# Patient Record
Sex: Female | Born: 1980 | Race: White | Hispanic: No | Marital: Single | Smoking: Never smoker
Health system: Southern US, Community
[De-identification: ages and names within clinical notes are randomized; demographics above are authoritative.]

## PROBLEM LIST (undated history)

## (undated) DIAGNOSIS — R55 Syncope and collapse: Secondary | ICD-10-CM

## (undated) DIAGNOSIS — K219 Gastro-esophageal reflux disease without esophagitis: Secondary | ICD-10-CM

## (undated) DIAGNOSIS — R1310 Dysphagia, unspecified: Secondary | ICD-10-CM

## (undated) HISTORY — PX: EYE SURGERY: SHX253

## (undated) HISTORY — DX: Dysphagia, unspecified: R13.10

## (undated) HISTORY — DX: Syncope and collapse: R55

---

## 2005-12-15 ENCOUNTER — Ambulatory Visit: Payer: Self-pay | Admitting: Family Medicine

## 2005-12-20 ENCOUNTER — Ambulatory Visit: Payer: Self-pay | Admitting: Family Medicine

## 2013-06-10 ENCOUNTER — Encounter (HOSPITAL_COMMUNITY): Payer: Self-pay | Admitting: Emergency Medicine

## 2013-06-10 ENCOUNTER — Emergency Department (HOSPITAL_COMMUNITY)
Admission: EM | Admit: 2013-06-10 | Discharge: 2013-06-11 | Disposition: A | Discharge status: Home / Self Care | Payer: PRIVATE HEALTH INSURANCE | Attending: Emergency Medicine | Admitting: Emergency Medicine

## 2013-06-10 DIAGNOSIS — Z3202 Encounter for pregnancy test, result negative: Secondary | ICD-10-CM | POA: Insufficient documentation

## 2013-06-10 DIAGNOSIS — N939 Abnormal uterine and vaginal bleeding, unspecified: Secondary | ICD-10-CM

## 2013-06-10 DIAGNOSIS — D259 Leiomyoma of uterus, unspecified: Secondary | ICD-10-CM | POA: Insufficient documentation

## 2013-06-10 DIAGNOSIS — R42 Dizziness and giddiness: Secondary | ICD-10-CM | POA: Insufficient documentation

## 2013-06-10 LAB — CBC
HCT: 39.8 % (ref 36.0–46.0)
Hemoglobin: 13.3 g/dL (ref 12.0–15.0)
MCH: 28.2 pg (ref 26.0–34.0)
MCHC: 33.4 g/dL (ref 30.0–36.0)
RBC: 4.72 MIL/uL (ref 3.87–5.11)

## 2013-06-10 NOTE — ED Notes (Signed)
Bed: ZO10 Expected date:  Expected time:  Means of arrival:  Comments: EMS vag bleeding

## 2013-06-10 NOTE — ED Notes (Signed)
Pt has been having vaginal bleeding similar to a period but she states she only has one approx. 3 times a year. States she got in shower and had bleeding and felt like something 'fell out.' EMS reports pt not acting age appropriate.

## 2013-06-10 NOTE — ED Provider Notes (Signed)
CSN: [id number]     Arrival date & time 06/10/13  2218 History   First MD Initiated Contact with Patient 06/10/13 2241     Chief Complaint  Patient presents with  . Vaginal Bleeding   (Consider location/radiation/quality/duration/timing/severity/associated sxs/prior Treatment) HPI Comments: The patient is a 32 year-old female that has never be evaluated by a gynecologist, presenting the Emergency Department with a chief complaint of abnormal vaginal bleeding for 1 day. She reports she was in the shower when she noticed a large amount of blood and clots.  She also reports she felt "something drop" when asked to elaborate she stated "like my uterus was falling out". The patient reports LNMP 2 weeks ago and she has always had irregular periods (3 a year).  Denies history of STI. The pateint's mother reports the patient had a near syncopal event after seeing the blood.  She states she felt lightheaded after seeing the amount of blood in the shower. The patient's mother reports seeing the patient slowly colapse to the floor without a full LOC and no trauma to the head.  She is not sexually active and denies insertion of foreign objects into her vagina or rectum. No PCP.  The patient also follows a vegan diet and is on Vitamin B12 supplement. No history of anemia or bleeding clotting disorder.  Patient is a 32 y.o. female presenting with vaginal bleeding. The history is provided by the patient and a relative. No language interpreter was used.  Vaginal Bleeding Quality:  Bright red and clots Severity:  Moderate Onset quality:  Sudden Duration:  1 day Timing:  Constant Chronicity:  New Menstrual history:  Irregular Number of pads used:  2 Number of tampons used:  0 Possible pregnancy: no   Context: spontaneously   Context: not after urination, not during intercourse and not during urination   Context comment:  While in the shower Relieved by:  None tried Worsened by:  Nothing tried Ineffective  treatments:  None tried Associated symptoms: dizziness   Associated symptoms: no abdominal pain, no dysuria, no fever, no nausea and no vaginal discharge   Risk factors: no bleeding disorder, no hx of ectopic pregnancy, no hx of endometriosis, no gynecological surgery, no PID and no STD     History reviewed. No pertinent past medical history. Past Surgical History  Procedure Laterality Date  . Eye surgery     No family history on file. History  Substance Use Topics  . Smoking status: Never Smoker   . Smokeless tobacco: Not on file  . Alcohol Use: No   OB History   Grav Para Term Preterm Abortions TAB SAB Ect Mult Living                 Review of Systems  Constitutional: Negative for fever and chills.  Gastrointestinal: Negative for nausea, vomiting, abdominal pain, diarrhea, constipation, blood in stool, anal bleeding and rectal pain.  Genitourinary: Positive for vaginal bleeding and menstrual problem. Negative for dysuria, urgency, frequency, hematuria, vaginal discharge, genital sores and pelvic pain.  Skin: Negative for pallor and rash.  Neurological: Positive for dizziness.  Psychiatric/Behavioral: Negative for self-injury.  All other systems reviewed and are negative.    Allergies  Review of patient's allergies indicates no known allergies.  Home Medications   Current Outpatient Rx  Name  Route  Sig  Dispense  Refill  . Cyanocobalamin (VITAMIN B 12 PO)   Oral   Take 1 tablet by mouth every other day.  BP 132/73  Pulse 99  Temp(Src) 98.7 F (37.1 C) (Oral)  Resp 16  SpO2 100%  LMP 05/10/2013 Physical Exam  Nursing note and vitals reviewed. Constitutional: She appears well-developed and well-nourished. No distress.  HENT:  Head: Normocephalic and atraumatic.  Eyes: No scleral icterus.  Neck: Neck supple.  Cardiovascular: Normal rate and regular rhythm.   No murmur heard. Pulmonary/Chest: Breath sounds normal. She has no wheezes. She has no  rales.  Abdominal: Soft. Bowel sounds are normal. She exhibits no distension. There is no tenderness. There is no rebound and no guarding.  Genitourinary: There is bleeding around the vagina.  Large amount of bright red blood and clots in the vaginal vault.  Unable to visualize cervix.  Neurological: She is alert.  Skin: Skin is warm and dry.  No signs of anemia    ED Course  Procedures (including critical care time) Labs Review Labs Reviewed  CBC  POCT PREGNANCY, URINE   Imaging Review No results found.  EKG Interpretation   None       MDM   1. Uterine fibroid   2. Vaginal bleeding    Pt with a history of irregular periods presents with abnormal vaginal bleeding only 2 weeks after last menstrual cycle. Abdomen non-tender, no signs of anemia on exam. UA-hCG ordered, CBC to evaluate for anemia.  Pelvic ordered. On Pelvic exam there was bright red blood and  Several clots in the vaginal vault, despite trying to absorb this with gauze I was unable to visualize the cervix.  Also there was a piece of tissue that came into view each time the specula was inserted. And I was unable to identify the etiology of the tissue.  Discussed pt condition and pelvic exam with Dr. Norlene Campbell who agreed to perform a pelvic for identification of cervix and tissue.  Dr. Norlene Campbell to consult OB/GYN. OB/GYN will follow up with the patient in clinic for treatment. Discussed lab results and treatment plan with the patient and patient's mother.  She reports understanding and no other concerns at this time.   Patient is stable for discharge at this time.     Clabe Seal, PA-C 06/12/13 1600  Leotis Shames Doretha Imus, PA-C 06/12/13 [phone]

## 2013-06-11 MED ORDER — HYDROCODONE-ACETAMINOPHEN 5-325 MG PO TABS: 1.0000 | ORAL_TABLET | ORAL | Status: DC | PRN

## 2013-06-15 NOTE — ED Provider Notes (Signed)
Medical screening examination/treatment/procedure(s) were conducted as a shared visit with non-physician practitioner(s) and myself.  I personally evaluated the patient during the encounter.  Pt with heavy bleeding, mass noted extruding from cervical os on pelvic exam.  D/w on call gyn who agrees most likely pedunculated fibroid.  Hemodynamically stable, h/h stable.  Pt to f/u with their clinic as outpatient for further evaluation and treatment.    Olivia Mackie, MD 06/15/13 [phone]

## 2013-06-16 ENCOUNTER — Encounter: Payer: Self-pay | Admitting: Obstetrics and Gynecology

## 2013-06-16 ENCOUNTER — Other Ambulatory Visit (HOSPITAL_COMMUNITY)
Admission: RE | Admit: 2013-06-16 | Discharge: 2013-06-16 | Disposition: A | Discharge status: Home / Self Care | Payer: PRIVATE HEALTH INSURANCE | Source: Ambulatory Visit | Attending: Obstetrics and Gynecology | Admitting: Obstetrics and Gynecology

## 2013-06-16 ENCOUNTER — Ambulatory Visit (INDEPENDENT_AMBULATORY_CARE_PROVIDER_SITE_OTHER): Payer: PRIVATE HEALTH INSURANCE | Admitting: Obstetrics and Gynecology

## 2013-06-16 VITALS — BP 151/94 | HR 86 | Ht 63.0 in | Wt 186.4 lb

## 2013-06-16 DIAGNOSIS — N841 Polyp of cervix uteri: Secondary | ICD-10-CM | POA: Insufficient documentation

## 2013-06-16 DIAGNOSIS — N898 Other specified noninflammatory disorders of vagina: Secondary | ICD-10-CM

## 2013-06-16 DIAGNOSIS — N939 Abnormal uterine and vaginal bleeding, unspecified: Secondary | ICD-10-CM

## 2013-06-16 NOTE — Progress Notes (Signed)
Patient ID: Holly Bates, female   DOB: 23-Oct-1980, 32 y.o.   MRN: [id number] 32 yo G0 with LMP 12/2 here for ED follow up for abnormal uterine bleeding. Patient reports having irregular cycles at baseline and reports having 4-6 cycles per year. Patient states she had a normal 7 day period in late November but her bleeding returned heavier 2 weeks later with cramping pains and passage of clots. Patient was seen in ED and told she had a fibroid coming out. Patient is otherwise without complaints. Patient states this was the first time she has experienced abnormal vaginal bleeding.  History reviewed. No pertinent past medical history. Past Surgical History  Procedure Laterality Date  . Eye surgery     Family History  Problem Relation Age of Onset  . Heart disease Father    History  Substance Use Topics  . Smoking status: Never Smoker   . Smokeless tobacco: Not on file  . Alcohol Use: No   GENERAL: Well-developed, well-nourished female in no acute distress.  ABDOMEN: Soft, nontender, nondistended. No organomegaly. PELVIC: Normal external female genitalia. Vagina is pink and rugated.  Normal discharge. Normal appearing cervix with 5 cm polyp extending into vagina. Uterus is normal in size. No adnexal mass or tenderness. EXTREMITIES: No cyanosis, clubbing, or edema, 2+ distal pulses.  A/P 32 yo G0 with abnormal vaginal bleeding - After informed consent was obtained, the base of the cervical polyp was grasped with a Kelly clamp and removed without difficulty. Minimal bleeding noted. Patient tolerated the procedure well. - RTC in 2 weeks for results and further management of oligomenorrhea. Discussed need for contraception in this setting in order to decrease risk of endometrial cancer. Patient verbalized understanding and birth control options were discussed.

## 2013-06-16 NOTE — Patient Instructions (Signed)
Contraception Choices °Birth control (contraception) is the use of any methods or devices to stop pregnancy from happening. Below are some methods to help avoid pregnancy. °HORMONAL BIRTH CONTROL °· A small tube put under the skin of the upper arm (implant). The tube can stay in place for 3 years. The implant must be taken out after 3 years. °· Shots given every 3 months. °· Pills taken every day. °· Patches that are changed once a week. °· A ring put into the vagina (vaginal ring). The ring is left in place for 3 weeks and removed for 1 week. Then, a new ring is put in the vagina. °· Emergency birth control pills taken after unprotected sex (intercourse). °BARRIER BIRTH CONTROL  °· A thin covering worn on the penis (female condom) during sex. °· A soft, loose covering put into the vagina (female condom) before sex. °· A rubber bowl that sits over the cervix (diaphragm). The bowl must be made for you. The bowl is put into the vagina before sex. The bowl is left in place for 6 to 8 hours after sex. °· A small, soft cup that fits over the cervix (cervical cap). The cup must be made for you. The cup can be left in place for 48 hours after sex. °· A sponge that is put into the vagina before sex. °· A chemical that kills or stops sperm from getting into the cervix and uterus (spermicide). The chemical may be a cream, jelly, foam, or pill. °INTRAUTERINE (IUD) BIRTH CONTROL  °· IUD birth control is a small, T-shaped piece of plastic. The plastic is put inside the uterus. There are 2 types of IUD: °· Copper IUD. The IUD is covered in copper wire. The copper makes a fluid that kills sperm. It can stay in place for 10 years. °· Hormone IUD. The hormone stops pregnancy from happening. It can stay in place for 5 years. °PERMANENT METHODS °· When the woman has her fallopian tubes sealed, tied, or blocked during surgery. This stops the egg from traveling to the uterus. °· The doctor places a small coil or insert into each fallopian  tube. This causes scar tissue to form and blocks the fallopian tubes. °· When the female has the tubes that carry sperm tied off (vasectomy). °NATURAL FAMILY PLANNING BIRTH CONTROL  °· Natural family planning means not having sex or using barrier birth control on the days the woman could become pregnant. °· Use a calendar to keep track of the length of each period and know the days she can get pregnant. °· Avoid sex during ovulation. °· Use a thermometer to measure body temperature. Also watch for symptoms of ovulation. °· Time sex to be after the woman has ovulated. °Use condoms to help protect yourself against sexually transmitted infections (STIs). Do this no matter what type of birth control you use. Talk to your doctor about which type of birth control is best for you. °Document Released: 04/23/2009 Document Revised: 02/26/2013 Document Reviewed: 01/15/2013 °ExitCare® Patient Information ©2014 [employer]. ° °

## 2013-06-16 NOTE — Progress Notes (Signed)
Pt started cycle on 06/10/13 and it was very heavy, passing clots. She reports that she fainted and went to the emergency room for evaluation. Still bleeding today but is lighter.

## 2013-06-18 ENCOUNTER — Telehealth: Payer: Self-pay | Admitting: *Deleted

## 2013-06-18 NOTE — Telephone Encounter (Signed)
Message copied by Mannie Stabile on Wed Jun 18, 2013 11:33 AM ------      Message from: CONSTANT, PEGGY      Created: Wed Jun 18, 2013  8:56 AM       Please inform patient that mass on cervix was a benign polyp. She should continue to keep a bleeding calendar and return to office if abnormal bleeding persists. She should also follow up with PCP or with [place] for birth control initiation for the management of her irregular bleeding pattern (as I had discussed with her) ------

## 2013-06-18 NOTE — Telephone Encounter (Signed)
Called patient and a female answered and stated that she wasn't home now and he couldn't take a message.

## 2013-06-19 NOTE — Telephone Encounter (Signed)
Called patient and informed her of results and recommendations. Patient verbalized understanding and stated that she has already made an appt with [place] for next month for possible PCOS. Patient had no further questions

## 2013-07-01 ENCOUNTER — Encounter: Payer: Self-pay | Admitting: *Deleted

## 2013-08-04 ENCOUNTER — Ambulatory Visit (INDEPENDENT_AMBULATORY_CARE_PROVIDER_SITE_OTHER): Payer: PRIVATE HEALTH INSURANCE | Admitting: Obstetrics and Gynecology

## 2013-08-04 ENCOUNTER — Encounter: Payer: Self-pay | Admitting: Obstetrics and Gynecology

## 2013-08-04 VITALS — BP 161/94 | HR 104 | Temp 97.0°F | Ht 63.0 in | Wt 188.9 lb

## 2013-08-04 DIAGNOSIS — Z3049 Encounter for surveillance of other contraceptives: Secondary | ICD-10-CM

## 2013-08-04 DIAGNOSIS — Z01812 Encounter for preprocedural laboratory examination: Secondary | ICD-10-CM

## 2013-08-04 DIAGNOSIS — IMO0001 Reserved for inherently not codable concepts without codable children: Secondary | ICD-10-CM

## 2013-08-04 LAB — POCT PREGNANCY, URINE: Preg Test, Ur: NEGATIVE

## 2013-08-04 MED ORDER — MEDROXYPROGESTERONE ACETATE 104 MG/0.65ML ~~LOC~~ SUSP
104.0000 mg | Freq: Once | SUBCUTANEOUS | Status: AC
  Administered 2013-08-04: 104 mg via SUBCUTANEOUS

## 2013-08-04 NOTE — Progress Notes (Signed)
Patient ID: Holly Bates, female   DOB: 03/27/1981, 33 y.o.   MRN: [id number] 33 yo with oligomenorrhea likely secondary to PCOS presenting today to initiate birth control as previously recommended to decrease risk of endometrial hyperplasia/carcinoma. Patient has never taken birth control in the past. All birth control options were reviewed and explained to the patient. Patient is interested in OCP. After further probing, patient reports elevated BP over the past few months along with today's visit. Discussed progesterone only options and patient opted for Depo-Provera.  Patient to follow up with PCP for further evaluation and management of her hypertension. Patient informed of normal dysfunctional vaginal bleeding that is expected after initiation of Depo-Provera. If DUB persists for more than 6 months, we will discuss other options  RTC for repeat depo and prn

## 2013-10-27 ENCOUNTER — Ambulatory Visit: Payer: PRIVATE HEALTH INSURANCE

## 2014-05-31 ENCOUNTER — Emergency Department (INDEPENDENT_AMBULATORY_CARE_PROVIDER_SITE_OTHER)
Admission: EM | Admit: 2014-05-31 | Discharge: 2014-05-31 | Disposition: A | Discharge status: Home / Self Care | Payer: Self-pay | Source: Home / Self Care | Attending: Family Medicine | Admitting: Family Medicine

## 2014-05-31 ENCOUNTER — Ambulatory Visit (HOSPITAL_COMMUNITY): Payer: Self-pay | Attending: Emergency Medicine

## 2014-05-31 ENCOUNTER — Encounter (HOSPITAL_COMMUNITY): Payer: Self-pay

## 2014-05-31 DIAGNOSIS — R109 Unspecified abdominal pain: Secondary | ICD-10-CM | POA: Insufficient documentation

## 2014-05-31 DIAGNOSIS — R110 Nausea: Secondary | ICD-10-CM

## 2014-05-31 DIAGNOSIS — M549 Dorsalgia, unspecified: Secondary | ICD-10-CM

## 2014-05-31 DIAGNOSIS — R630 Anorexia: Secondary | ICD-10-CM | POA: Insufficient documentation

## 2014-05-31 LAB — CBC WITH DIFFERENTIAL/PLATELET
BASOS ABS: 0 10*3/uL (ref 0.0–0.1)
BASOS PCT: 0 % (ref 0–1)
EOS ABS: 0.2 10*3/uL (ref 0.0–0.7)
EOS PCT: 2 % (ref 0–5)
HCT: 41.2 % (ref 36.0–46.0)
Hemoglobin: 13.9 g/dL (ref 12.0–15.0)
Lymphocytes Relative: 35 % (ref 12–46)
Lymphs Abs: 3 10*3/uL (ref 0.7–4.0)
MCH: 28.3 pg (ref 26.0–34.0)
MCHC: 33.7 g/dL (ref 30.0–36.0)
MCV: 83.9 fL (ref 78.0–100.0)
Monocytes Absolute: 0.5 10*3/uL (ref 0.1–1.0)
Monocytes Relative: 6 % (ref 3–12)
Neutro Abs: 4.8 10*3/uL (ref 1.7–7.7)
Neutrophils Relative %: 57 % (ref 43–77)
PLATELETS: 258 10*3/uL (ref 150–400)
RBC: 4.91 MIL/uL (ref 3.87–5.11)
RDW: 12.3 % (ref 11.5–15.5)
WBC: 8.4 10*3/uL (ref 4.0–10.5)

## 2014-05-31 LAB — COMPREHENSIVE METABOLIC PANEL
ALBUMIN: 4 g/dL (ref 3.5–5.2)
ALK PHOS: 66 U/L (ref 39–117)
ALT: 9 U/L (ref 0–35)
AST: 16 U/L (ref 0–37)
Anion gap: 14 (ref 5–15)
BUN: 11 mg/dL (ref 6–23)
CALCIUM: 9.3 mg/dL (ref 8.4–10.5)
CO2: 24 mEq/L (ref 19–32)
Chloride: 102 mEq/L (ref 96–112)
Creatinine, Ser: 0.66 mg/dL (ref 0.50–1.10)
GFR calc Af Amer: >90 mL/min (ref >90)
GFR calc non Af Amer: >90 mL/min (ref >90)
Glucose, Bld: 121 mg/dL — ABNORMAL HIGH (ref 70–99)
POTASSIUM: 3.2 meq/L — AB (ref 3.7–5.3)
SODIUM: 140 meq/L (ref 137–147)
TOTAL PROTEIN: 7.6 g/dL (ref 6.0–8.3)
Total Bilirubin: 0.4 mg/dL (ref 0.3–1.2)

## 2014-05-31 LAB — POCT URINALYSIS DIP (DEVICE)
BILIRUBIN URINE: NEGATIVE
GLUCOSE, UA: NEGATIVE mg/dL
HGB URINE DIPSTICK: NEGATIVE
KETONES UR: NEGATIVE mg/dL
Leukocytes, UA: NEGATIVE
Nitrite: NEGATIVE
Protein, ur: NEGATIVE mg/dL
Urobilinogen, UA: 0.2 mg/dL (ref 0.0–1.0)
pH: 5.5 (ref 5.0–8.0)

## 2014-05-31 LAB — LIPASE, BLOOD: Lipase: 30 U/L (ref 11–59)

## 2014-05-31 LAB — D-DIMER, QUANTITATIVE (NOT AT ARMC)

## 2014-05-31 MED ORDER — TRAMADOL HCL 50 MG PO TABS: 50.0000 mg | ORAL_TABLET | Freq: Four times a day (QID) | ORAL | Status: DC | PRN

## 2014-05-31 MED ORDER — ONDANSETRON 8 MG PO TBDP
8.0000 mg | ORAL_TABLET | Freq: Three times a day (TID) | ORAL | Status: DC | PRN
Start: 2014-05-31 — End: 2015-03-24

## 2014-05-31 NOTE — ED Notes (Signed)
C/o pain thoracic area x 3 days, denies injury, denies heavy lifting. NAD. W/D/color good

## 2014-05-31 NOTE — ED Provider Notes (Signed)
CSN: [id number]     Arrival date & time 05/31/14  1051 History   First MD Initiated Contact with Patient 05/31/14 1115     Chief Complaint  Patient presents with  . Back Pain   (Consider location/radiation/quality/duration/timing/severity/associated sxs/prior Treatment) HPI         33 year old female presents for evaluation of back pain. For the past 3 days she has had constant upper back pain that is worse with any movement. The pain is localized to her upper back but occasionally will radiate around her right flank.  She also has mild nausea without vomiting. Denies fever, chills, chest pain, shortness of breath, cough. No recent travel or sick contacts. No history of DVT or PE. No personal or family history of connective tissue disorders. She does not take any hormonal contraceptives.    History reviewed. No pertinent past medical history. Past Surgical History  Procedure Laterality Date  . Eye surgery     Family History  Problem Relation Age of Onset  . Heart disease Father    History  Substance Use Topics  . Smoking status: Never Smoker   . Smokeless tobacco: Not on file  . Alcohol Use: No   OB History    No data available     Review of Systems  Gastrointestinal: Positive for nausea. Negative for vomiting, abdominal pain and diarrhea.  Musculoskeletal: Positive for back pain.    Allergies  Review of patient's allergies indicates no known allergies.  Home Medications   Prior to Admission medications   Medication Sig Start Date End Date Taking? Authorizing Provider  Cyanocobalamin (VITAMIN B 12 PO) Take 1 tablet by mouth every other day.    Historical Provider, MD  ondansetron (ZOFRAN ODT) 8 MG disintegrating tablet Take 1 tablet (8 mg total) by mouth every 8 (eight) hours as needed for nausea or vomiting. 05/31/14   Liam Graham, PA-C  traMADol (ULTRAM) 50 MG tablet Take 1-2 tablets (50-100 mg total) by mouth every 6 (six) hours as needed. 05/31/14   Freeman Caldron  Deseri Loss, PA-C   BP 150/95 mmHg  Pulse 130  Temp(Src) 98.5 F (36.9 C) (Oral)  Resp 16  SpO2 100%  LMP 05/14/2014 Physical Exam  Constitutional: She is oriented to person, place, and time. Vital signs are normal. She appears well-developed and well-nourished. No distress.  HENT:  Head: Normocephalic and atraumatic.  Right Ear: External ear normal.  Left Ear: External ear normal.  Nose: Nose normal.  Mouth/Throat: Oropharynx is clear and moist. No oropharyngeal exudate.  Eyes: Conjunctivae and EOM are normal. Pupils are equal, round, and reactive to light. Right eye exhibits no discharge. Left eye exhibits no discharge.  Neck: Normal range of motion. Neck supple.  Cardiovascular: Regular rhythm, normal heart sounds and normal pulses.  Tachycardia present.   Pulses:      Radial pulses are 2+ on the right side, and 2+ on the left side.  BP equal bilateral arms  Pulmonary/Chest: Effort normal and breath sounds normal. No accessory muscle usage. No respiratory distress. She has no wheezes. She has no rales. She exhibits no mass and no tenderness.  Musculoskeletal:       Thoracic back: She exhibits tenderness and pain. She exhibits normal range of motion, no bony tenderness, no swelling, no edema, no deformity and no spasm.       Back:  Lymphadenopathy:    She has no cervical adenopathy.  Neurological: She is alert and oriented to person, place, and time. She  has normal strength and normal reflexes. No cranial nerve deficit or sensory deficit. She exhibits normal muscle tone. She displays a negative Romberg sign. Coordination and gait normal. GCS eye subscore is 4. GCS verbal subscore is 5. GCS motor subscore is 6.  Skin: Skin is warm and dry. No rash noted. She is not diaphoretic.  Psychiatric: She has a normal mood and affect. Judgment normal.  Nursing note and vitals reviewed.   ED Course  Procedures (including critical care time) Labs Review Labs Reviewed  COMPREHENSIVE METABOLIC  PANEL - Abnormal; Notable for the following:    Potassium 3.2 (*)    Glucose, Bld 121 (*)    All other components within normal limits  CBC WITH DIFFERENTIAL  LIPASE, BLOOD  D-DIMER, QUANTITATIVE  POCT URINALYSIS DIP (DEVICE)    Imaging Review Dg Abd Acute W/chest  05/31/2014   CLINICAL DATA:  Nausea and bilateral flank pain.  Loss of appetite.  EXAM: ACUTE ABDOMEN SERIES (ABDOMEN 2 VIEW & CHEST 1 VIEW)  COMPARISON:  None.  FINDINGS: There is no evidence of dilated bowel loops or free intraperitoneal air. No radiopaque calculi or other significant radiographic abnormality is seen. Moderate fecal material in the colon without evidence of focal impaction.  Heart size and mediastinal contours are within normal limits. Mild atelectasis present at both lung bases. There is no evidence of pulmonary edema, consolidation, pneumothorax, nodule or pleural fluid.  IMPRESSION: Moderate fecal material without evidence of bowel obstruction. No acute cardiopulmonary disease.   Electronically Signed   By: Aletta Edouard M.D.   On: 05/31/2014 12:49     MDM   1. Nausea   2. Back pain    Because of the nausea, slight dizziness, tachycardia, and no known cause of her pain, her evaluation included x-ray and labs which were all normal. At rest, her heart rate is down into the 90s. workup was all negative/normal. Treat for uncomplicated back pain. ED if worsening.   Meds ordered this encounter  Medications  . traMADol (ULTRAM) 50 MG tablet    Sig: Take 1-2 tablets (50-100 mg total) by mouth every 6 (six) hours as needed.    Dispense:  20 tablet    Refill:  0    Order Specific Question:  Supervising Provider    Answer:  Lynne Leader, [place]  . ondansetron (ZOFRAN ODT) 8 MG disintegrating tablet    Sig: Take 1 tablet (8 mg total) by mouth every 8 (eight) hours as needed for nausea or vomiting.    Dispense:  12 tablet    Refill:  0    Order Specific Question:  Supervising Provider    Answer:  Lynne Leader, [place]     Liam Graham, PA-C 05/31/14 1337

## 2014-05-31 NOTE — Discharge Instructions (Signed)
Back Pain, Adult Low back pain is very common. About 1 in 5 people have back pain.The cause of low back pain is rarely dangerous. The pain often gets better over time.About half of people with a sudden onset of back pain feel better in just 2 weeks. About 8 in 10 people feel better by 6 weeks.  CAUSES Some common causes of back pain include:  Strain of the muscles or ligaments supporting the spine.  Wear and tear (degeneration) of the spinal discs.  Arthritis.  Direct injury to the back. DIAGNOSIS Most of the time, the direct cause of low back pain is not known.However, back pain can be treated effectively even when the exact cause of the pain is unknown.Answering your caregiver's questions about your overall health and symptoms is one of the most accurate ways to make sure the cause of your pain is not dangerous. If your caregiver needs more information, he or she may order lab work or imaging tests (X-rays or MRIs).However, even if imaging tests show changes in your back, this usually does not require surgery. HOME CARE INSTRUCTIONS For many people, back pain returns.Since low back pain is rarely dangerous, it is often a condition that people can learn to manageon their own.   Remain active. It is stressful on the back to sit or stand in one place. Do not sit, drive, or stand in one place for more than 30 minutes at a time. Take short walks on level surfaces as soon as pain allows.Try to increase the length of time you walk each day.  Do not stay in bed.Resting more than 1 or 2 days can delay your recovery.  Do not avoid exercise or work.Your body is made to move.It is not dangerous to be active, even though your back may hurt.Your back will likely heal faster if you return to being active before your pain is gone.  Pay attention to your body when you bend and lift. Many people have less discomfortwhen lifting if they bend their knees, keep the load close to their bodies,and  avoid twisting. Often, the most comfortable positions are those that put less stress on your recovering back.  Find a comfortable position to sleep. Use a firm mattress and lie on your side with your knees slightly bent. If you lie on your back, put a pillow under your knees.  Only take over-the-counter or prescription medicines as directed by your caregiver. Over-the-counter medicines to reduce pain and inflammation are often the most helpful.Your caregiver may prescribe muscle relaxant drugs.These medicines help dull your pain so you can more quickly return to your normal activities and healthy exercise.  Put ice on the injured area.  Put ice in a plastic bag.  Place a towel between your skin and the bag.  Leave the ice on for 15-20 minutes, 03-04 times a day for the first 2 to 3 days. After that, ice and heat may be alternated to reduce pain and spasms.  Ask your caregiver about trying back exercises and gentle massage. This may be of some benefit.  Avoid feeling anxious or stressed.Stress increases muscle tension and can worsen back pain.It is important to recognize when you are anxious or stressed and learn ways to manage it.Exercise is a great option. SEEK MEDICAL CARE IF:  You have pain that is not relieved with rest or medicine.  You have pain that does not improve in 1 week.  You have new symptoms.  You are generally not feeling well. SEEK   IMMEDIATE MEDICAL CARE IF:   You have pain that radiates from your back into your legs.  You develop new bowel or bladder control problems.  You have unusual weakness or numbness in your arms or legs.  You develop nausea or vomiting.  You develop abdominal pain.  You feel faint. Document Released: 06/26/2005 Document Revised: 12/26/2011 Document Reviewed: 10/28/2013 ExitCare Patient Information 2015 [employer]. This information is not intended to replace advice given to you by your health care provider. Make sure you  discuss any questions you have with your health care provider.  

## 2014-10-17 ENCOUNTER — Emergency Department (HOSPITAL_COMMUNITY)
Admission: EM | Admit: 2014-10-17 | Discharge: 2014-10-18 | Disposition: A | Discharge status: Home / Self Care | Payer: Self-pay | Attending: Emergency Medicine | Admitting: Emergency Medicine

## 2014-10-17 ENCOUNTER — Emergency Department (HOSPITAL_COMMUNITY): Payer: Self-pay

## 2014-10-17 ENCOUNTER — Encounter (HOSPITAL_COMMUNITY): Payer: Self-pay | Admitting: Emergency Medicine

## 2014-10-17 DIAGNOSIS — Y9289 Other specified places as the place of occurrence of the external cause: Secondary | ICD-10-CM | POA: Insufficient documentation

## 2014-10-17 DIAGNOSIS — S92354A Nondisplaced fracture of fifth metatarsal bone, right foot, initial encounter for closed fracture: Secondary | ICD-10-CM | POA: Insufficient documentation

## 2014-10-17 DIAGNOSIS — Y998 Other external cause status: Secondary | ICD-10-CM | POA: Insufficient documentation

## 2014-10-17 DIAGNOSIS — Z79899 Other long term (current) drug therapy: Secondary | ICD-10-CM | POA: Insufficient documentation

## 2014-10-17 DIAGNOSIS — S92301A Fracture of unspecified metatarsal bone(s), right foot, initial encounter for closed fracture: Secondary | ICD-10-CM

## 2014-10-17 DIAGNOSIS — Y9301 Activity, walking, marching and hiking: Secondary | ICD-10-CM | POA: Insufficient documentation

## 2014-10-17 DIAGNOSIS — W1839XA Other fall on same level, initial encounter: Secondary | ICD-10-CM | POA: Insufficient documentation

## 2014-10-17 NOTE — ED Notes (Signed)
Pt states she fell while walking her dog. Pt states she injured her R foot. Pt has pain to lateral side of R foot. States she is unable to bear wt on R foot. Pt took 2 Tylenol PTA. Rates pain 7/10.

## 2014-10-18 MED ORDER — IBUPROFEN 800 MG PO TABS: 800.0000 mg | ORAL_TABLET | Freq: Three times a day (TID) | ORAL | Status: DC

## 2014-10-18 MED ORDER — IBUPROFEN 800 MG PO TABS
800.0000 mg | ORAL_TABLET | Freq: Once | ORAL | Status: AC
  Administered 2014-10-18: 800 mg via ORAL
  Filled 2014-10-18: qty 1

## 2014-10-18 MED ORDER — HYDROCODONE-ACETAMINOPHEN 5-325 MG PO TABS: 1.0000 | ORAL_TABLET | ORAL | Status: DC | PRN

## 2014-10-18 NOTE — Discharge Instructions (Signed)
Metatarsal Fracture, Undisplaced  A metatarsal fracture is a break in the bone(s) of the foot. These are the bones of the foot that connect your toes to the bones of the ankle.  DIAGNOSIS   The diagnoses of these fractures are usually made with X-rays. If there are problems in the forefoot and x-rays are normal a later bone scan will usually make the diagnosis.   TREATMENT AND HOME CARE INSTRUCTIONS  · Treatment may or may not include a cast or walking shoe. When casts are needed the use is usually for short periods of time so as not to slow down healing with muscle wasting (atrophy).  · Activities should be stopped until further advised by your caregiver.  · Wear shoes with adequate shock absorbing capabilities and stiff soles.  · Alternative exercise may be undertaken while waiting for healing. These may include bicycling and swimming, or as your caregiver suggests.  · It is important to keep all follow-up visits or specialty referrals. The failure to keep these appointments could result in improper bone healing and chronic pain or disability.  · Warning: Do not drive a car or operate a motor vehicle until your caregiver specifically tells you it is safe to do so.  IF YOU DO NOT HAVE A CAST OR SPLINT:  · You may walk on your injured foot as tolerated or advised.  · Do not put any weight on your injured foot for as long as directed by your caregiver. Slowly increase the amount of time you walk on the foot as the pain allows or as advised.  · Use crutches until you can bear weight without pain. A gradual increase in weight bearing may help.  · Apply ice to the injury for 15-20 minutes each hour while awake for the first 2 days. Put the ice in a plastic bag and place a towel between the bag of ice and your skin.  · Only take over-the-counter or prescription medicines for pain, discomfort, or fever as directed by your caregiver.  SEEK IMMEDIATE MEDICAL CARE IF:   · Your cast gets damaged or breaks.  · You have  continued severe pain or more swelling than you did before the cast was put on, or the pain is not controlled with medications.  · Your skin or nails below the injury turn blue or grey, or feel cold or numb.  · There is a bad smell, or new stains or pus-like (purulent) drainage coming from the cast.  MAKE SURE YOU:   · Understand these instructions.  · Will watch your condition.  · Will get help right away if you are not doing well or get worse.  Document Released: 03/18/2002 Document Revised: 09/18/2011 Document Reviewed: 02/07/2008  ExitCare® Patient Information ©2015 [employer]. This information is not intended to replace advice given to you by your health care provider. Make sure you discuss any questions you have with your health care provider.

## 2014-10-18 NOTE — ED Provider Notes (Signed)
CSN: [id number]     Arrival date & time 10/17/14  2243 History   First MD Initiated Contact with Patient 10/17/14 2307     Chief Complaint  Patient presents with  . Foot Injury     (Consider location/radiation/quality/duration/timing/severity/associated sxs/prior Treatment) Patient is a 34 y.o. female presenting with foot injury. The history is provided by the patient. No language interpreter was used.  Foot Injury Location:  Foot Injury: yes   Foot location:  R foot Pain details:    Severity:  Moderate   Duration:  3 hours Chronicity:  New Associated symptoms: no fever   Associated symptoms comment:  She injured her right foot while walking her dog. She mistepped, inverted the foot then fell on top of it causing pain and swelling to dorsal lateral surface. She states she cannot weight bear. No other injury.   History reviewed. No pertinent past medical history. Past Surgical History  Procedure Laterality Date  . Eye surgery     Family History  Problem Relation Age of Onset  . Heart disease Father    History  Substance Use Topics  . Smoking status: Never Smoker   . Smokeless tobacco: Not on file  . Alcohol Use: No   OB History    No data available     Review of Systems  Constitutional: Negative for fever and chills.  Musculoskeletal:       See HPI.  Skin: Negative.  Negative for wound.  Neurological: Negative.  Negative for numbness.      Allergies  Review of patient's allergies indicates no known allergies.  Home Medications   Prior to Admission medications   Medication Sig Start Date End Date Taking? Authorizing Provider  acetaminophen (TYLENOL) 325 MG tablet Take 650 mg by mouth every 6 (six) hours as needed for moderate pain.   Yes Historical Provider, MD  Cyanocobalamin (VITAMIN B 12 PO) Take 1 tablet by mouth every other day.   Yes Historical Provider, MD  vitamin D, CHOLECALCIFEROL, 400 UNITS tablet Take 800 Units by mouth daily.   Yes Historical  Provider, MD  ondansetron (ZOFRAN ODT) 8 MG disintegrating tablet Take 1 tablet (8 mg total) by mouth every 8 (eight) hours as needed for nausea or vomiting. Patient not taking: Reported on 10/17/2014 05/31/14   Liam Graham, PA-C  traMADol (ULTRAM) 50 MG tablet Take 1-2 tablets (50-100 mg total) by mouth every 6 (six) hours as needed. Patient not taking: Reported on 10/17/2014 05/31/14   Liam Graham, PA-C   BP 131/76 mmHg  Pulse 97  Temp(Src) 98.4 F (36.9 C) (Oral)  Resp 17  SpO2 100%  LMP 09/12/2014 Physical Exam  Constitutional: She is oriented to person, place, and time. She appears well-developed and well-nourished.  Neck: Normal range of motion.  Pulmonary/Chest: Effort normal.  Musculoskeletal:  Right foot has mild swelling over dorsal mid-foot along 5th MT. No bony deformity. Focally tender. FROM all digits.  Neurological: She is alert and oriented to person, place, and time.  Skin: Skin is warm and dry.    ED Course  Procedures (including critical care time) Labs Review Labs Reviewed - No data to display  Imaging Review Dg Foot Complete Right  10/17/2014   CLINICAL DATA:  Fall wall walking her dog. Right foot injury. Lateral foot pain. Unable to bear weight.  EXAM: RIGHT FOOT COMPLETE - 3+ VIEW  COMPARISON:  None.  FINDINGS: A nondisplaced fracture is present at the base of the fifth metatarsal. No  additional fractures are present. Mild soft tissue swelling is associated.  IMPRESSION: Nondisplaced fracture at the base of the fifth metatarsal.   Electronically Signed   By: San Morelle M.D.   On: 10/17/2014 23:57     EKG Interpretation None      MDM   Final diagnoses:  None    1. Fracture, right foot  ND fracture involving 5th MT base. Watson-Jones dressing, post-op shoe and crutches provided. Will refer to ortho for recheck.     Charlann Lange, PA-C 10/18/14 0010  Rolland Porter, MD 10/18/14 [id number]

## 2014-11-09 ENCOUNTER — Encounter (HOSPITAL_COMMUNITY): Payer: Self-pay | Admitting: Emergency Medicine

## 2014-11-09 ENCOUNTER — Emergency Department (HOSPITAL_COMMUNITY)
Admission: EM | Admit: 2014-11-09 | Discharge: 2014-11-09 | Disposition: A | Discharge status: Home / Self Care | Payer: Self-pay | Attending: Emergency Medicine | Admitting: Emergency Medicine

## 2014-11-09 ENCOUNTER — Emergency Department (HOSPITAL_COMMUNITY): Payer: Self-pay

## 2014-11-09 ENCOUNTER — Emergency Department (HOSPITAL_COMMUNITY)
Admission: EM | Admit: 2014-11-09 | Discharge: 2014-11-09 | Discharge status: Absent without leave | Payer: Self-pay | Source: Home / Self Care

## 2014-11-09 DIAGNOSIS — R0989 Other specified symptoms and signs involving the circulatory and respiratory systems: Secondary | ICD-10-CM

## 2014-11-09 DIAGNOSIS — F458 Other somatoform disorders: Secondary | ICD-10-CM | POA: Insufficient documentation

## 2014-11-09 DIAGNOSIS — Z791 Long term (current) use of non-steroidal anti-inflammatories (NSAID): Secondary | ICD-10-CM | POA: Insufficient documentation

## 2014-11-09 DIAGNOSIS — Z79899 Other long term (current) drug therapy: Secondary | ICD-10-CM | POA: Insufficient documentation

## 2014-11-09 NOTE — ED Notes (Signed)
Pt. reports a piece of candy is stuck below her throat last week , airway intact/respirations unlabored. No visible object at oral cavity . No cough or emesis .

## 2014-11-09 NOTE — Discharge Instructions (Signed)
Please read and follow all provided instructions.  Your diagnoses today include:  1. Globus sensation     Tests performed today include:  Vital signs. See below for your results today.   X-ray of neck - normal, no foreign bodies on x-ray  Medications prescribed:   None  Home care instructions:  Follow any educational materials contained in this packet.  Follow-up instructions: Please follow-up with your primary care provider as needed for further evaluation of your symptoms.  Return instructions:   Please return to the Emergency Department if you experience worsening symptoms.   Please return if you have any other emergent concerns.  Additional Information:  Your vital signs today were: BP 129/79 mmHg   Pulse 103   Temp(Src) 99.8 F (37.7 C) (Oral)   Resp 14   Ht 5\' 3"  (1.6 m)   Wt 175 lb (79.379 kg)   BMI 31.01 kg/m2   SpO2 97%   LMP 11/08/2014 If your blood pressure (BP) was elevated above 135/85 this visit, please have this repeated by your doctor within one month. ---------------

## 2014-11-09 NOTE — ED Provider Notes (Signed)
CSN: [id number]     Arrival date & time 11/09/14  1944 History  This chart was scribed for non-physician practitioner Carlisle Cater, PA-C working with Alfonzo Beers, MD by Hilda Lias, ED Scribe. This patient was seen in room TR01C/TR01C and the patient's care was started at 8:40 PM.    CC: Foreign body in throat   The history is provided by the patient. No language interpreter was used.     HPI Comments: Holly Bates is a 34 y.o. female who presents to the Emergency Department complaining of an odd sensation in her throat that has been present since 10/31/14. Pt states she feels there is a piece of candy stuck in her throat. Symptoms started after eating a piece of 'taffy candy with gummy balls in it'.  Pt notes she is able to swallow liquids and foods with no problems or regurgitation. Pt states she has tried to eat bread and gargle with salt water to remove the sensation with no relief. Pt denies any difficulty breathing and denies problems with esophagus in the past. No URI symptoms, sore throat, facial swelling, pain with movement of neck. No reported fevers.    History reviewed. No pertinent past medical history. Past Surgical History  Procedure Laterality Date  . Eye surgery     Family History  Problem Relation Age of Onset  . Heart disease Father    History  Substance Use Topics  . Smoking status: Never Smoker   . Smokeless tobacco: Not on file  . Alcohol Use: No   OB History    No data available     Review of Systems  Constitutional: Negative for fever and appetite change.  HENT: Positive for trouble swallowing (FB sensation, no difficulty swallowing). Negative for rhinorrhea and sore throat.   Eyes: Negative for redness.  Respiratory: Negative for cough.   Cardiovascular: Negative for chest pain.  Gastrointestinal: Negative for nausea, vomiting, abdominal pain and diarrhea.  Genitourinary: Negative for dysuria.  Musculoskeletal: Negative for myalgias.  Skin:  Negative for rash.  Neurological: Negative for headaches.      Allergies  Review of patient's allergies indicates no known allergies.  Home Medications   Prior to Admission medications   Medication Sig Start Date End Date Taking? Authorizing Provider  acetaminophen (TYLENOL) 325 MG tablet Take 650 mg by mouth every 6 (six) hours as needed for moderate pain.    Historical Provider, MD  Cyanocobalamin (VITAMIN B 12 PO) Take 1 tablet by mouth every other day.    Historical Provider, MD  HYDROcodone-acetaminophen (NORCO/VICODIN) 5-325 MG per tablet Take 1-2 tablets by mouth every 4 (four) hours as needed. 10/18/14   Charlann Lange, PA-C  ibuprofen (ADVIL,MOTRIN) 800 MG tablet Take 1 tablet (800 mg total) by mouth 3 (three) times daily. 10/18/14   Shari Upstill, PA-C  ondansetron (ZOFRAN ODT) 8 MG disintegrating tablet Take 1 tablet (8 mg total) by mouth every 8 (eight) hours as needed for nausea or vomiting. Patient not taking: Reported on 10/17/2014 05/31/14   Liam Graham, PA-C  traMADol (ULTRAM) 50 MG tablet Take 1-2 tablets (50-100 mg total) by mouth every 6 (six) hours as needed. Patient not taking: Reported on 10/17/2014 05/31/14   Liam Graham, PA-C  vitamin D, CHOLECALCIFEROL, 400 UNITS tablet Take 800 Units by mouth daily.    Historical Provider, MD   BP 129/79 mmHg  Pulse 103  Temp(Src) 99.8 F (37.7 C) (Oral)  Resp 14  Ht 5\' 3"  (1.6 m)  Wt  175 lb (79.379 kg)  BMI 31.01 kg/m2  SpO2 97%  LMP 11/08/2014 Physical Exam  Constitutional: She appears well-developed and well-nourished.  HENT:  Head: Normocephalic and atraumatic.  Mouth/Throat: Oropharynx is clear and moist.  Eyes: Conjunctivae are normal. Right eye exhibits no discharge. Left eye exhibits no discharge.  Neck: Normal range of motion. Neck supple. No tracheal deviation present.  Cardiovascular: Normal rate, regular rhythm and normal heart sounds.   Pulmonary/Chest: Effort normal and breath sounds normal. No  stridor. No respiratory distress.  Abdominal: Soft. There is no tenderness.  Neurological: She is alert.  Skin: Skin is warm and dry.  Psychiatric: She has a normal mood and affect.  Nursing note and vitals reviewed.   ED Course  Procedures (including critical care time)  DIAGNOSTIC STUDIES: Oxygen Saturation is 97% on room air, normal by my interpretation.    COORDINATION OF CARE: 8:46 PM Discussed treatment plan with pt at bedside and pt agreed to plan.   Labs Review Labs Reviewed - No data to display  Imaging Review Dg Neck Soft Tissue  11/09/2014   CLINICAL DATA:  Foreign body sensation in the throat. Ingested object 10/31/2014.  EXAM: NECK SOFT TISSUES - 1+ VIEW  COMPARISON:  None.  FINDINGS: There is no evidence of retropharyngeal soft tissue swelling or epiglottic enlargement. The cervical airway is unremarkable and no radio-opaque foreign body identified.  IMPRESSION: Negative.   Electronically Signed   By: Dereck Ligas M.D.   On: 11/09/2014 21:26     EKG Interpretation None       Vital signs reviewed and are as follows: Filed Vitals:   11/09/14 2139  BP: 130/78  Pulse: 81  Temp:   Resp: 14   Patient and family member informed of results. GI referral given for further evaluation if not improving. Discussed that patient may have residual irritation and no retained foreign body. Patient encouraged to return to the emergency department for fever, worsening sore throat, difficulty swallowing, difficulty breathing, pain with movement of her neck, or other concerns. Patient verbalizes understanding and agrees with plan.  MDM   Final diagnoses:  Globus sensation   Patient with foreign body sensation in her throat for greater than one week without any difficulty with swallowing or breathing. Soft tissue neck x-rays negative for any acute findings. Do not suspect infectious process. No documented fevers. No pain with movement of the neck. Exam is not suspicious for  epiglottitis or deep space neck infection or abscess.  I personally performed the services described in this documentation, which was scribed in my presence. The recorded information has been reviewed and is accurate.    Carlisle Cater, PA-C 11/09/14 2157  Alfonzo Beers, MD 11/09/14 2200

## 2014-11-23 ENCOUNTER — Encounter: Payer: Self-pay | Admitting: Gastroenterology

## 2015-01-25 ENCOUNTER — Ambulatory Visit (INDEPENDENT_AMBULATORY_CARE_PROVIDER_SITE_OTHER): Payer: Self-pay | Admitting: Gastroenterology

## 2015-01-25 ENCOUNTER — Encounter: Payer: Self-pay | Admitting: Gastroenterology

## 2015-01-25 VITALS — BP 134/80 | HR 88 | Ht 63.0 in | Wt 190.4 lb

## 2015-01-25 DIAGNOSIS — F458 Other somatoform disorders: Secondary | ICD-10-CM

## 2015-01-25 DIAGNOSIS — R0989 Other specified symptoms and signs involving the circulatory and respiratory systems: Secondary | ICD-10-CM

## 2015-01-25 NOTE — Patient Instructions (Signed)
You have been scheduled for an endoscopy. Please follow written instructions given to you at your visit today. If you use inhalers (even only as needed), please bring them with you on the day of your procedure. Your physician has requested that you go to www.startemmi.com and enter the access code given to you at your visit today. This web site gives a general overview about your procedure. However, you should still follow specific instructions given to you by our office regarding your preparation for the procedure.  You can purchase omeprazole 20 mg over the counter for you to take once daily.   Patient advised to avoid spicy, acidic, citrus, chocolate, mints, fruit and fruit juices.  Limit the intake of caffeine, alcohol and Soda.  Don't exercise too soon after eating.  Don't lie down within 3-4 hours of eating.  Elevate the head of your bed.  Thank you for choosing me and [facility].  Pricilla Riffle. Dagoberto Ligas., MD., [facility]  Normal BMI (Body Mass Index- based on height and weight) is between 19 and 25. Your BMI today is Body mass index is 33.74 kg/(m^2). Marland Kitchen Please consider follow up  regarding your BMI with your Primary Care Provider.

## 2015-01-25 NOTE — Progress Notes (Signed)
    History of Present Illness: This is a 34 year old female with globus senstion referred by Alfonzo Beers, MD. Patient states she has a sensation of a lump in her throat or something stuck in her throat for the past several months. She describes a feeling like there is a piece of candy stuck in her throat. She was seen at [facility] ED in early May and the evaluation was unremarkable including neck films. Her symptoms have persisted and are unchanged by meals. She has not tried any acid suppression therapy or any other treatments. Denies weight loss, abdominal pain, constipation, diarrhea, change in stool caliber, melena, hematochezia, nausea, vomiting, dysphagia, chest pain.  Review of Systems: Pertinent positive and negative review of systems were noted in the above HPI section. All other review of systems were otherwise negative.  Current Medications, Allergies, Past Medical History, Past Surgical History, Family History and Social History were reviewed in [employer] record.  Physical Exam: General: Well developed, well nourished, no acute distress Head: Normocephalic and atraumatic Eyes:  sclerae anicteric, EOMI Ears: Normal auditory acuity Mouth: No deformity or lesions Neck: Supple, no masses or thyromegaly Lungs: Clear throughout to auscultation Heart: Regular rate and rhythm; no murmurs, rubs or bruits Abdomen: Soft, non tender and non distended. No masses, hepatosplenomegaly or hernias noted. Normal Bowel sounds Musculoskeletal: Symmetrical with no gross deformities  Skin: No lesions on visible extremities Pulses:  Normal pulses noted Extremities: No clubbing, cyanosis, edema or deformities noted Neurological: Alert oriented x 4, grossly nonfocal Cervical Nodes:  No significant cervical adenopathy Inguinal Nodes: No significant inguinal adenopathy Psychological:  Alert and cooperative. Normal mood and affect  Assessment and Recommendations:  1. Globus  sensation. Rule out GERD. Begin Prilosec OTC 20 mg daily and standard antireflux measures. Schedule endoscopy. The risks (including bleeding, perforation, infection, missed lesions, medication reactions and possible hospitalization or surgery if complications occur), benefits, and alternatives to endoscopy with possible biopsy and possible dilation were discussed with the patient and they consent to proceed. If EGD is unrevealing and symptoms do not respond to treatment for GERD she will need further evaluation by her PCP and ENT.   cc: Alfonzo Beers, MD

## 2015-03-08 ENCOUNTER — Ambulatory Visit (AMBULATORY_SURGERY_CENTER): Payer: Self-pay | Admitting: Gastroenterology

## 2015-03-08 ENCOUNTER — Encounter: Payer: Self-pay | Admitting: Gastroenterology

## 2015-03-08 VITALS — BP 123/86 | HR 76 | Temp 98.6°F | Resp 15 | Ht 63.0 in | Wt 190.0 lb

## 2015-03-08 DIAGNOSIS — K219 Gastro-esophageal reflux disease without esophagitis: Secondary | ICD-10-CM

## 2015-03-08 DIAGNOSIS — R0989 Other specified symptoms and signs involving the circulatory and respiratory systems: Secondary | ICD-10-CM

## 2015-03-08 DIAGNOSIS — F458 Other somatoform disorders: Secondary | ICD-10-CM

## 2015-03-08 MED ORDER — SODIUM CHLORIDE 0.9 % IV SOLN: 500.0000 mL | INTRAVENOUS | Status: DC

## 2015-03-08 NOTE — Patient Instructions (Signed)
Please call Dr. Fuller Plan if globussymptoms do not resolve with an antireflux regimine and daily PPI.    YOU HAD AN ENDOSCOPIC PROCEDURE TODAY AT [facility]:   Refer to the procedure report that was given to you for any specific questions about what was found during the examination.  If the procedure report does not answer your questions, please call your gastroenterologist to clarify.  If you requested that your care partner not be given the details of your procedure findings, then the procedure report has been included in a sealed envelope for you to review at your convenience later.  YOU SHOULD EXPECT: Some feelings of bloating in the abdomen. Passage of more gas than usual.  Walking can help get rid of the air that was put into your GI tract during the procedure and reduce the bloating. If you had a lower endoscopy (such as a colonoscopy or flexible sigmoidoscopy) you may notice spotting of blood in your stool or on the toilet paper. If you underwent a bowel prep for your procedure, you may not have a normal bowel movement for a few days.  Please Note:  You might notice some irritation and congestion in your nose or some drainage.  This is from the oxygen used during your procedure.  There is no need for concern and it should clear up in a day or so.  SYMPTOMS TO REPORT IMMEDIATELY:    Following upper endoscopy (EGD)  Vomiting of blood or coffee ground material  New chest pain or pain under the shoulder blades  Painful or persistently difficult swallowing  New shortness of breath  Fever of 100F or higher  Black, tarry-looking stools  For urgent or emergent issues, a gastroenterologist can be reached at any hour by calling [phone].   DIET: Your first meal following the procedure should be a small meal and then it is ok to progress to your normal diet. Heavy or fried foods are harder to digest and may make you feel nauseous or bloated.  Likewise, meals heavy in dairy  and vegetables can increase bloating.  Drink plenty of fluids but you should avoid alcoholic beverages for 24 hours.  ACTIVITY:  You should plan to take it easy for the rest of today and you should NOT DRIVE or use heavy machinery until tomorrow (because of the sedation medicines used during the test).    FOLLOW UP: Our staff will call the number listed on your records the next business day following your procedure to check on you and address any questions or concerns that you may have regarding the information given to you following your procedure. If we do not reach you, we will leave a message.  However, if you are feeling well and you are not experiencing any problems, there is no need to return our call.  We will assume that you have returned to your regular daily activities without incident.  If any biopsies were taken you will be contacted by phone or by letter within the next 1-3 weeks.  Please call us at [phone] if you have not heard about the biopsies in 3 weeks.    SIGNATURES/CONFIDENTIALITY: You and/or your care partner have signed paperwork which will be entered into your electronic medical record.  These signatures attest to the fact that that the information above on your After Visit Summary has been reviewed and is understood.  Full responsibility of the confidentiality of this discharge information lies with you and/or your care-partner.

## 2015-03-08 NOTE — Progress Notes (Signed)
To recovery, report to Tyrell, RN, VSS. 

## 2015-03-08 NOTE — Op Note (Signed)
[facility]  [address]. [place], [address]   ENDOSCOPY PROCEDURE REPORT  PATIENT: Holly Bates, Holly Bates  MR#: [id number] BIRTHDATE: 09-24-1980 , 34  yrs. old GENDER: female ENDOSCOPIST: Ladene Artist, MD, [facility] Regal REFERRED BY:  Jill Alexanders MD PROCEDURE DATE:  03/08/2015 PROCEDURE:  EGD, diagnostic ASA CLASS:     Class II INDICATIONS:  Globus sensation, R/O GERD, LPR. MEDICATIONS: Monitored anesthesia care and Propofol 200 mg IV TOPICAL ANESTHETIC: none DESCRIPTION OF PROCEDURE: After the risks benefits and alternatives of the procedure were thoroughly explained, informed consent was obtained.  The LB [id number] endoscope was introduced through the mouth and advanced to the second portion of the duodenum , Without limitations.  The instrument was slowly withdrawn as the mucosa was fully examined.    ESOPHAGUS: The mucosa of the esophagus appeared normal. STOMACH: The mucosa and folds of the stomach appeared normal. DUODENUM: The duodenal mucosa showed no abnormalities in the bulb and 2nd part of the duodenum.  Retroflexed views revealed no abnormalities.   The scope was then withdrawn from the patient and the procedure completed.  COMPLICATIONS: There were no immediate complications.  ENDOSCOPIC IMPRESSION: 1.   The EGD appeared normal  RECOMMENDATIONS: 1.  If globus symptoms do not resolve with an anti-reflux regimen and a daily PPI would evaluate for non GI causes   [R eSigned:  Ladene Artist, MD, [facility] 03/08/2015 2:41 PM

## 2015-03-09 ENCOUNTER — Telehealth: Payer: Self-pay | Admitting: *Deleted

## 2015-03-09 NOTE — Telephone Encounter (Signed)
  Follow up Call-  Call back number 03/08/2015  Post procedure Call Back phone  # [phone]  Permission to leave phone message Yes     Patient questions:  Message left to call us if necessary.

## 2015-03-24 ENCOUNTER — Emergency Department (INDEPENDENT_AMBULATORY_CARE_PROVIDER_SITE_OTHER)
Admission: EM | Admit: 2015-03-24 | Discharge: 2015-03-24 | Disposition: A | Discharge status: Home / Self Care | Payer: Self-pay | Source: Home / Self Care | Attending: Family Medicine | Admitting: Family Medicine

## 2015-03-24 ENCOUNTER — Encounter (HOSPITAL_COMMUNITY): Payer: Self-pay | Admitting: Emergency Medicine

## 2015-03-24 DIAGNOSIS — N200 Calculus of kidney: Secondary | ICD-10-CM

## 2015-03-24 LAB — POCT URINALYSIS DIP (DEVICE)
BILIRUBIN URINE: NEGATIVE
Glucose, UA: NEGATIVE mg/dL
KETONES UR: NEGATIVE mg/dL
LEUKOCYTES UA: NEGATIVE
NITRITE: NEGATIVE
Protein, ur: NEGATIVE mg/dL
SPECIFIC GRAVITY, URINE: 1.01 (ref 1.005–1.030)
Urobilinogen, UA: 0.2 mg/dL (ref 0.0–1.0)
pH: 7 (ref 5.0–8.0)

## 2015-03-24 LAB — POCT PREGNANCY, URINE: Preg Test, Ur: NEGATIVE

## 2015-03-24 MED ORDER — DICLOFENAC SODIUM 75 MG PO TBEC: 75.0000 mg | DELAYED_RELEASE_TABLET | Freq: Two times a day (BID) | ORAL | Status: DC

## 2015-03-24 MED ORDER — KETOROLAC TROMETHAMINE 60 MG/2ML IM SOLN
INTRAMUSCULAR | Status: AC
  Filled 2015-03-24: qty 2

## 2015-03-24 MED ORDER — KETOROLAC TROMETHAMINE 60 MG/2ML IM SOLN
60.0000 mg | Freq: Once | INTRAMUSCULAR | Status: AC
  Administered 2015-03-24: 60 mg via INTRAMUSCULAR

## 2015-03-24 MED ORDER — TAMSULOSIN HCL 0.4 MG PO CAPS: 0.4000 mg | ORAL_CAPSULE | Freq: Every day | ORAL | Status: DC

## 2015-03-24 NOTE — Discharge Instructions (Signed)
He had developed a kidney stone. This will take some time to pass. Please use the Flomax to help pass the stone, drink lots of fluids to promote urination, and start the Voltaren in 24 hours for additional pain relief. If your symptoms do not improve over the next 2-3 days or get significantly worse he needs to go to the emergency room or follow-up with urology. These remember to strain your urine to look for stone.    Kidney Stones Kidney stones (urolithiasis) are deposits that form inside your kidneys. The intense pain is caused by the stone moving through the urinary tract. When the stone moves, the ureter goes into spasm around the stone. The stone is usually passed in the urine.  CAUSES   A disorder that makes certain neck glands produce too much parathyroid hormone (primary hyperparathyroidism).  A buildup of uric acid crystals, similar to gout in your joints.  Narrowing (stricture) of the ureter.  A kidney obstruction present at birth (congenital obstruction).  Previous surgery on the kidney or ureters.  Numerous kidney infections. SYMPTOMS   Feeling sick to your stomach (nauseous).  Throwing up (vomiting).  Blood in the urine (hematuria).  Pain that usually spreads (radiates) to the groin.  Frequency or urgency of urination. DIAGNOSIS   Taking a history and physical exam.  Blood or urine tests.  CT scan.  Occasionally, an examination of the inside of the urinary bladder (cystoscopy) is performed. TREATMENT   Observation.  Increasing your fluid intake.  Extracorporeal shock wave lithotripsy--This is a noninvasive procedure that uses shock waves to break up kidney stones.  Surgery may be needed if you have severe pain or persistent obstruction. There are various surgical procedures. Most of the procedures are performed with the use of small instruments. Only small incisions are needed to accommodate these instruments, so recovery time is minimized. The size,  location, and chemical composition are all important variables that will determine the proper choice of action for you. Talk to your health care provider to better understand your situation so that you will minimize the risk of injury to yourself and your kidney.  HOME CARE INSTRUCTIONS   Drink enough water and fluids to keep your urine clear or pale yellow. This will help you to pass the stone or stone fragments.  Strain all urine through the provided strainer. Keep all particulate matter and stones for your health care provider to see. The stone causing the pain may be as small as a grain of salt. It is very important to use the strainer each and every time you pass your urine. The collection of your stone will allow your health care provider to analyze it and verify that a stone has actually passed. The stone analysis will often identify what you can do to reduce the incidence of recurrences.  Only take over-the-counter or prescription medicines for pain, discomfort, or fever as directed by your health care provider.  Make a follow-up appointment with your health care provider as directed.  Get follow-up X-rays if required. The absence of pain does not always mean that the stone has passed. It may have only stopped moving. If the urine remains completely obstructed, it can cause loss of kidney function or even complete destruction of the kidney. It is your responsibility to make sure X-rays and follow-ups are completed. Ultrasounds of the kidney can show blockages and the status of the kidney. Ultrasounds are not associated with any radiation and can be performed easily in a matter  of minutes. SEEK MEDICAL CARE IF:  You experience pain that is progressive and unresponsive to any pain medicine you have been prescribed. SEEK IMMEDIATE MEDICAL CARE IF:   Pain cannot be controlled with the prescribed medicine.  You have a fever or shaking chills.  The severity or intensity of pain increases over  18 hours and is not relieved by pain medicine.  You develop a new onset of abdominal pain.  You feel faint or pass out.  You are unable to urinate. MAKE SURE YOU:   Understand these instructions.  Will watch your condition.  Will get help right away if you are not doing well or get worse. Document Released: 06/26/2005 Document Revised: 02/26/2013 Document Reviewed: 11/27/2012 [facility] Patient Information 2015 [place], [place]. This information is not intended to replace advice given to you by your health care provider. Make sure you discuss any questions you have with your health care provider.

## 2015-03-24 NOTE — ED Notes (Signed)
Pt has been suffering from left lower back pain and hematuria since last night.  She states the blood is light, but she feels more pressure in her back when she has to urinate.  She denies any fever.

## 2015-03-24 NOTE — ED Provider Notes (Signed)
CSN: [id number]     Arrival date & time 03/24/15  1301 History   First MD Initiated Contact with Patient 03/24/15 1319     Chief Complaint  Patient presents with  . Hematuria  . Back Pain   (Consider location/radiation/quality/duration/timing/severity/associated sxs/prior Treatment) HPI  Left flank pain. Started 1 day ago. Some radiation inferiorly and posteriorly, constant but waxes and wanes, naproxen with some improvement. Nothing makes the pain worse. This morning patient developed red urine. Denies any pelvic pain, vaginal discharge, fevers, nausea, vomiting, constipation, rash.  Past Medical History  Diagnosis Date  . Dysphagia    Past Surgical History  Procedure Laterality Date  . Eye surgery     Family History  Problem Relation Age of Onset  . Heart disease Father    Social History  Substance Use Topics  . Smoking status: Never Smoker   . Smokeless tobacco: None  . Alcohol Use: No   OB History    No data available     Review of Systems Per HPI with all other pertinent systems negative.   Allergies  Review of patient's allergies indicates no known allergies.  Home Medications   Prior to Admission medications   Medication Sig Start Date End Date Taking? Authorizing Provider  Cyanocobalamin (VITAMIN B 12 PO) Take 1 tablet by mouth every other day.   Yes Historical Provider, MD  omeprazole (PRILOSEC) 10 MG capsule Take 10 mg by mouth daily.   Yes Historical Provider, MD  vitamin D, CHOLECALCIFEROL, 400 UNITS tablet Take 800 Units by mouth daily.   Yes Historical Provider, MD  acetaminophen (TYLENOL) 325 MG tablet Take 650 mg by mouth every 6 (six) hours as needed for moderate pain.    Historical Provider, MD  diclofenac (VOLTAREN) 75 MG EC tablet Take 1 tablet (75 mg total) by mouth 2 (two) times daily. 03/24/15   Waldemar Dickens, MD  tamsulosin (FLOMAX) 0.4 MG CAPS capsule Take 1 capsule (0.4 mg total) by mouth daily. 03/24/15   Waldemar Dickens, MD   Meds  Ordered and Administered this Visit   Medications  ketorolac (TORADOL) injection 60 mg (not administered)    BP 138/91 mmHg  Pulse 98  Temp(Src) 98.8 F (37.1 C) (Oral)  SpO2 98%  LMP 02/25/2015 No data found.   Physical Exam Physical Exam  Constitutional: oriented to person, place, and time. appears well-developed and well-nourished. No distress.  HENT:  Head: Normocephalic and atraumatic.  Eyes: EOMI. PERRL.  Neck: Normal range of motion.  Cardiovascular: RRR, no m/r/g, 2+ distal pulses,  Pulmonary/Chest: Effort normal and breath sounds normal. No respiratory distress.  Abdominal: Soft. Bowel sounds are normal. NonTTP, no distension.  Musculoskeletal: Normal range of motion. Non ttp, no effusion. No CVA tenderness  Neurological: alert and oriented to person, place, and time.  Skin: Skin is warm. No rash noted. non diaph.oretic.  Psychiatric: normal mood and affect. behavior is normal. Judgment and thought content normal.   ED Course  Procedures (including critical care time)  Labs Review Labs Reviewed  POCT URINALYSIS DIP (DEVICE) - Abnormal; Notable for the following:    Hgb urine dipstick LARGE (*)    All other components within normal limits  POCT PREGNANCY, URINE    Imaging Review No results found.   Visual Acuity Review  Right Eye Distance:   Left Eye Distance:   Bilateral Distance:    Right Eye Near:   Left Eye Near:    Bilateral Near:  MDM   1. Nephrolithiasis     Toradol 60 mg IM given. Patient to use strainer, start Flomax, increase fluid intake, and follow up in the ED or with urology if not improving   Waldemar Dickens, MD 03/24/15 1342

## 2016-01-19 ENCOUNTER — Telehealth: Payer: Self-pay

## 2016-01-19 NOTE — Telephone Encounter (Signed)
LMTCB

## 2016-01-19 NOTE — Telephone Encounter (Signed)
Go ahead and set up an appointment

## 2016-01-19 NOTE — Telephone Encounter (Signed)
Pt mom called the office to let us know that pt used to be seen by you years ago. Pt does not have insurance now and mom needs appt tomorrow. Mom is willing to pay out of pocket. Mom wanted to know if you would be willing to accept pt as a new patient again. I informed her that we do not accept new uninsured pts, but she requested I ask anyway. Mom, Remo Lipps, can be reached at [phone] you, Wells Guiles

## 2016-01-19 NOTE — Telephone Encounter (Signed)
appt scheduled. /RLB  

## 2016-01-20 ENCOUNTER — Encounter: Payer: Self-pay | Admitting: Family Medicine

## 2016-01-20 ENCOUNTER — Ambulatory Visit (INDEPENDENT_AMBULATORY_CARE_PROVIDER_SITE_OTHER): Payer: Self-pay | Admitting: Family Medicine

## 2016-01-20 VITALS — BP 130/80 | HR 82 | Ht 62.5 in | Wt 179.2 lb

## 2016-01-20 DIAGNOSIS — K5900 Constipation, unspecified: Secondary | ICD-10-CM

## 2016-01-20 NOTE — Patient Instructions (Signed)
Plenty of fluids, use the MiraLAX regularly for the next week or 2. 2 size we'll keep you regular

## 2016-01-20 NOTE — Progress Notes (Signed)
   Subjective:    Patient ID: Holly Bates, female    DOB: 27-May-1981, 35 y.o.   MRN: [id number]  HPI She is here for evaluation of constipation. She states that this started approximately 2 weeks ago. She has had no change in her normal lifestyle in regard to physical activities or eating. She has tried MiraLAX, Dulcolax, and was as well as increasing fluids and states that it is not helped. She's had no nausea, vomiting or abdominal pain.   Review of Systems     Objective:   Physical Exam Alert and in no distress. Abdominal exam shows no masses or tenderness with normal bowel sounds. Rectal exam does show a small amount of stool present in the vault.       Assessment & Plan:  Constipation, unspecified constipation type I explained that there was minimal stool in her vault and it could be she's miss diagnosing her symptoms. Recommend fluids and regular use of MiraLAX for the next week to see if this will help. She was comfortable with this.

## 2016-10-22 IMAGING — CR DG FOOT COMPLETE 3+V*R*
3 series · 3 of 3 positions shown · non-contrast
Comparison: None.

CLINICAL DATA: Fall wall walking her dog. Right foot injury.
Lateral foot pain. Unable to bear weight.

EXAM:
RIGHT FOOT COMPLETE - 3+ VIEW

[x foot ap right]
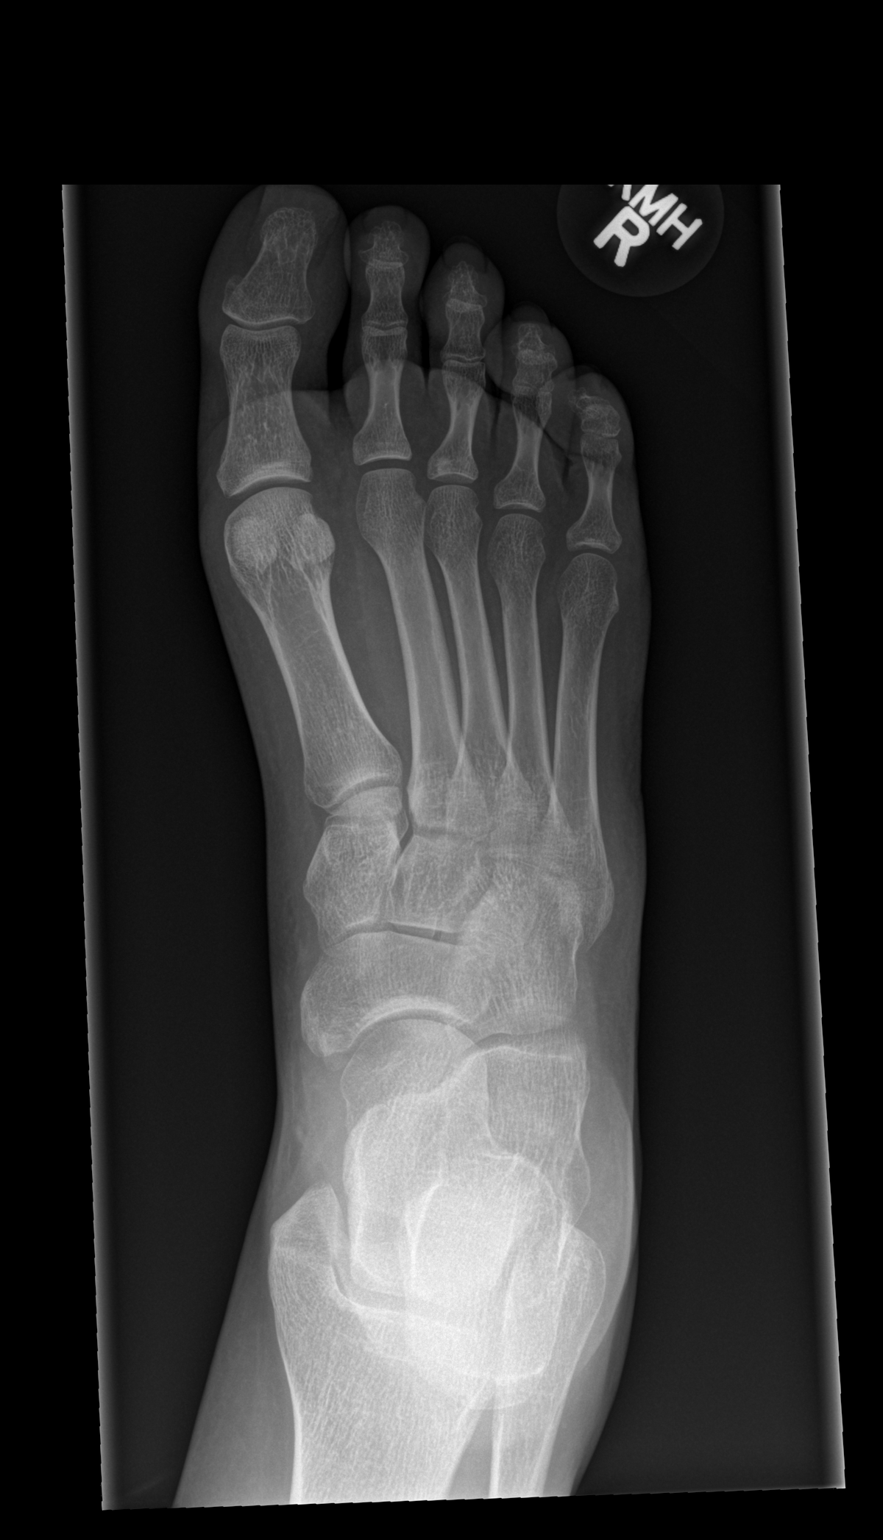

[x foot obl right]
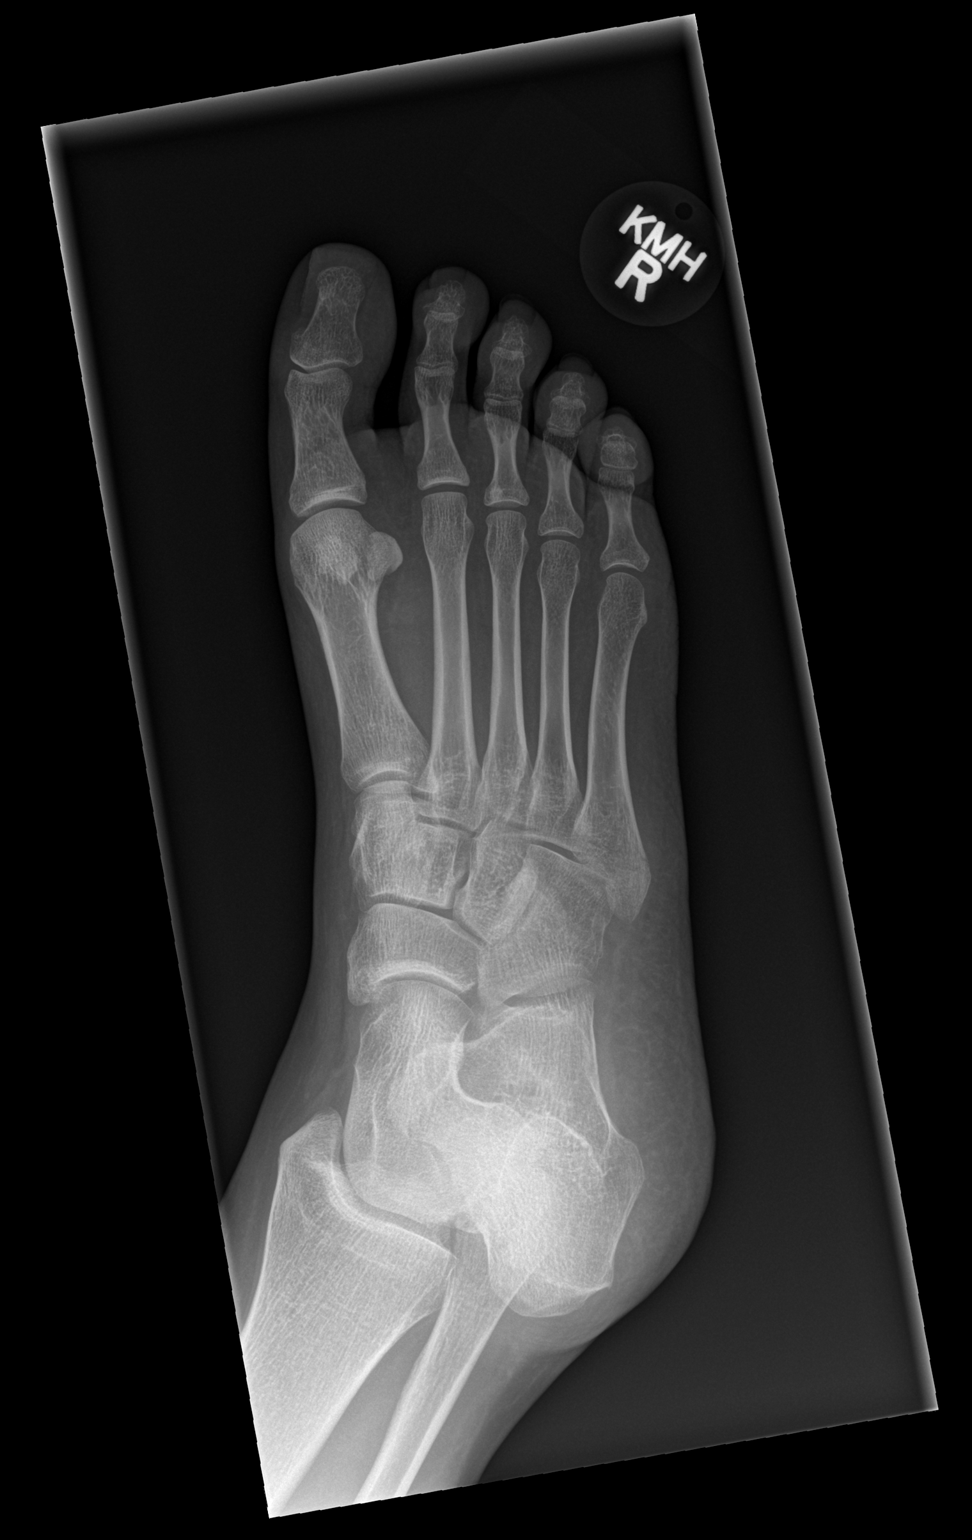

[x foot lat right]
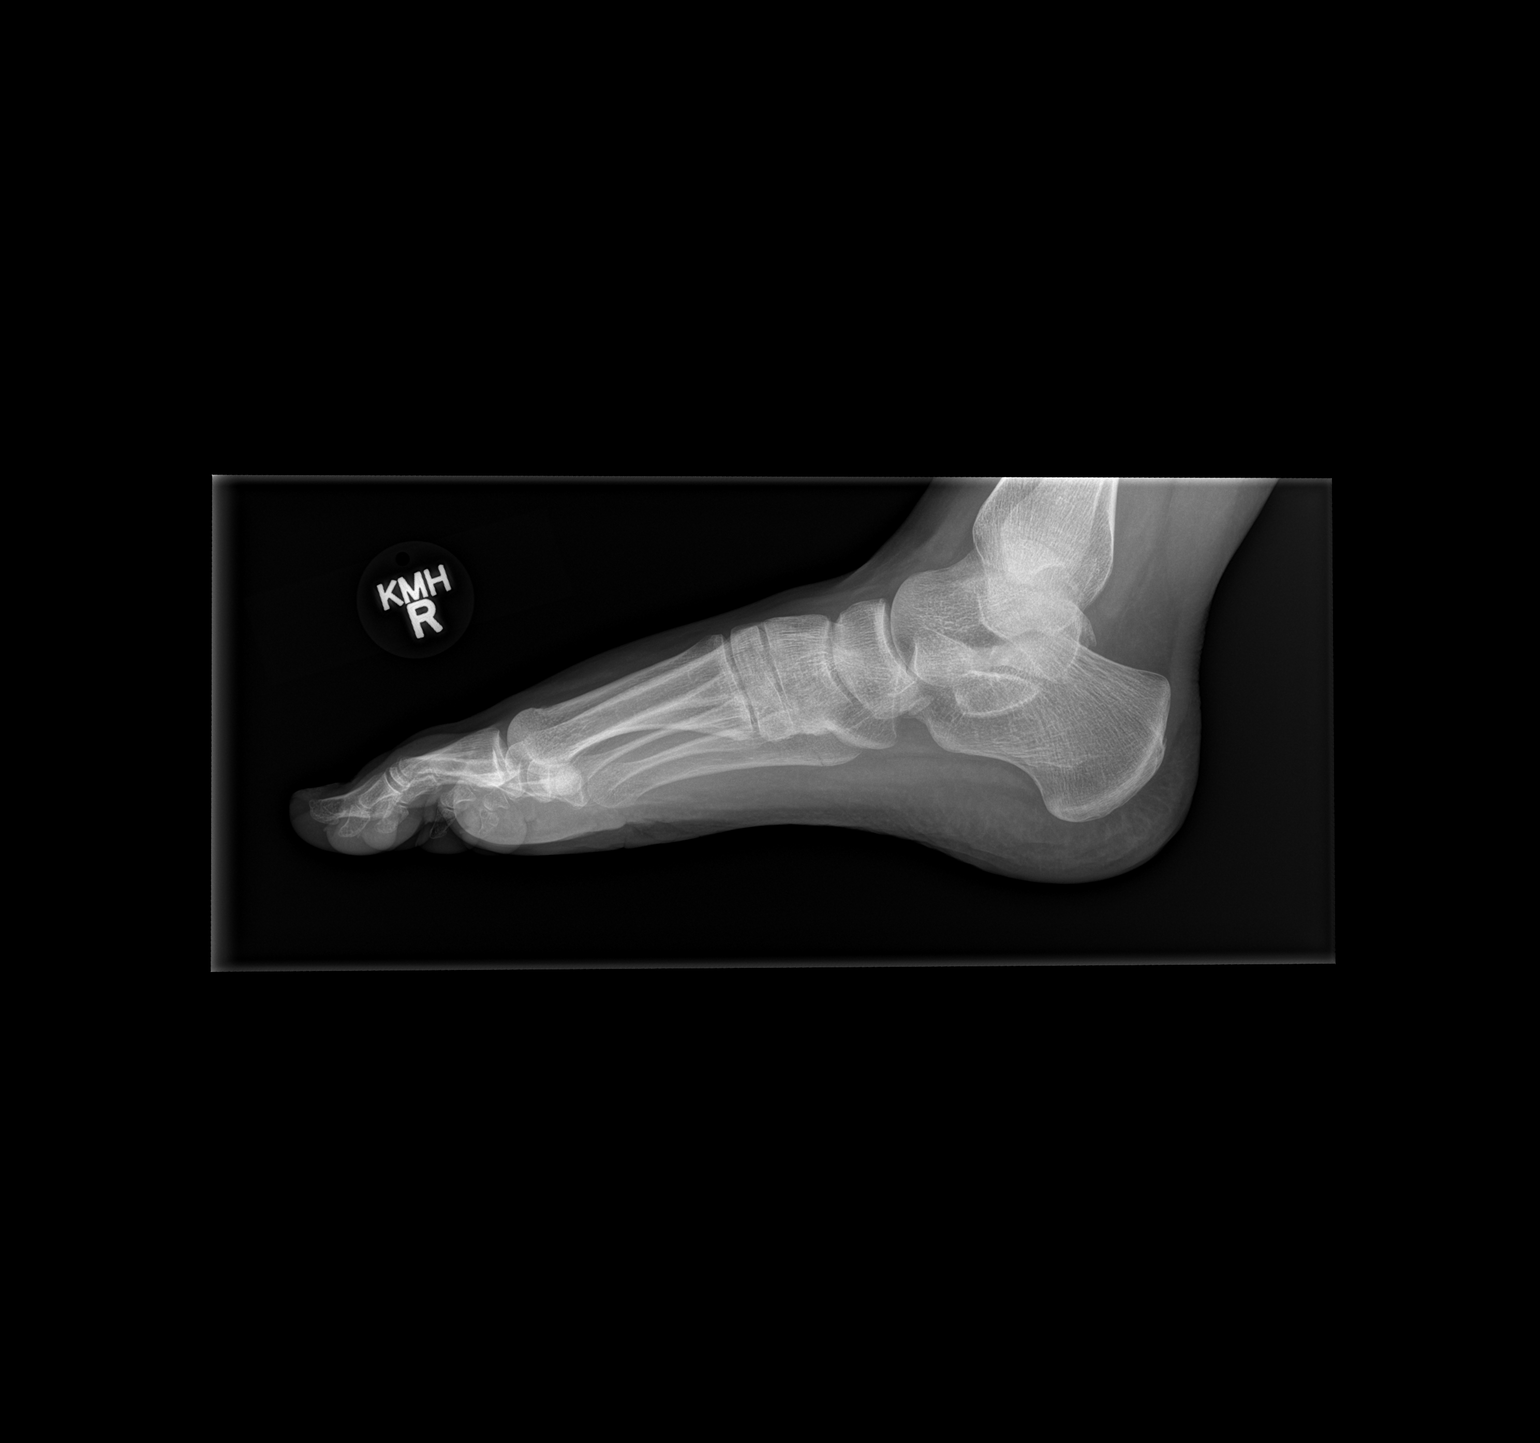

[3 of 3 positions shown; findings below may reference images not displayed]

FINDINGS: A nondisplaced fracture is present at the base of the fifth
metatarsal. No additional fractures are present. Mild soft tissue
swelling is associated.
IMPRESSION: Nondisplaced fracture at the base of the fifth metatarsal.

## 2018-01-15 ENCOUNTER — Encounter (HOSPITAL_BASED_OUTPATIENT_CLINIC_OR_DEPARTMENT_OTHER): Payer: Self-pay | Admitting: *Deleted

## 2018-01-15 ENCOUNTER — Emergency Department (HOSPITAL_BASED_OUTPATIENT_CLINIC_OR_DEPARTMENT_OTHER)
Admission: EM | Admit: 2018-01-15 | Discharge: 2018-01-15 | Disposition: A | Discharge status: Home / Self Care | Payer: Self-pay | Attending: Emergency Medicine | Admitting: Emergency Medicine

## 2018-01-15 ENCOUNTER — Other Ambulatory Visit: Payer: Self-pay

## 2018-01-15 DIAGNOSIS — Z79899 Other long term (current) drug therapy: Secondary | ICD-10-CM | POA: Insufficient documentation

## 2018-01-15 DIAGNOSIS — R55 Syncope and collapse: Secondary | ICD-10-CM | POA: Insufficient documentation

## 2018-01-15 LAB — URINALYSIS, ROUTINE W REFLEX MICROSCOPIC
Bilirubin Urine: NEGATIVE
Glucose, UA: NEGATIVE mg/dL
Hgb urine dipstick: NEGATIVE
KETONES UR: NEGATIVE mg/dL
LEUKOCYTES UA: NEGATIVE
NITRITE: NEGATIVE
PH: 8 (ref 5.0–8.0)
PROTEIN: NEGATIVE mg/dL
Specific Gravity, Urine: <1.005 — ABNORMAL LOW (ref 1.005–1.030)

## 2018-01-15 LAB — CBC WITH DIFFERENTIAL/PLATELET
Basophils Absolute: 0 10*3/uL (ref 0.0–0.1)
Basophils Relative: 0 %
EOS ABS: 0.1 10*3/uL (ref 0.0–0.7)
Eosinophils Relative: 1 %
HCT: 43.4 % (ref 36.0–46.0)
HEMOGLOBIN: 14.4 g/dL (ref 12.0–15.0)
Lymphocytes Relative: 15 %
Lymphs Abs: 1.5 10*3/uL (ref 0.7–4.0)
MCH: 28.6 pg (ref 26.0–34.0)
MCHC: 33.2 g/dL (ref 30.0–36.0)
MCV: 86.1 fL (ref 78.0–100.0)
Monocytes Absolute: 0.3 10*3/uL (ref 0.1–1.0)
Monocytes Relative: 3 %
NEUTROS PCT: 81 %
Neutro Abs: 8.5 10*3/uL — ABNORMAL HIGH (ref 1.7–7.7)
PLATELETS: 245 10*3/uL (ref 150–400)
RBC: 5.04 MIL/uL (ref 3.87–5.11)
RDW: 13.2 % (ref 11.5–15.5)
WBC: 10.4 10*3/uL (ref 4.0–10.5)

## 2018-01-15 LAB — BASIC METABOLIC PANEL
Anion gap: 7 (ref 5–15)
BUN: 10 mg/dL (ref 6–20)
CALCIUM: 9.1 mg/dL (ref 8.9–10.3)
CO2: 27 mmol/L (ref 22–32)
CREATININE: 0.66 mg/dL (ref 0.44–1.00)
Chloride: 107 mmol/L (ref 98–111)
Glucose, Bld: 104 mg/dL — ABNORMAL HIGH (ref 70–99)
Potassium: 3.6 mmol/L (ref 3.5–5.1)
SODIUM: 141 mmol/L (ref 135–145)

## 2018-01-15 LAB — PREGNANCY, URINE: PREG TEST UR: NEGATIVE

## 2018-01-15 NOTE — ED Triage Notes (Signed)
Pt c/o witnessed syncopal episode x 2 episodes today, c/o dizziness and palpatations

## 2018-01-15 NOTE — ED Provider Notes (Signed)
[facility] EMERGENCY DEPARTMENT Provider Note   CSN: [id number] Arrival date & time: 01/15/18  1403     History   Chief Complaint Chief Complaint  Patient presents with  . Loss of Consciousness    HPI Holly Bates is a 37 y.o. female who presents today for evaluation of loss of consciousness.  Her mother witnessed both of these episodes.  Patient reports that she was standing in front of the refrigerator when she started feeling lightheaded and fell to the ground.  Patient's mother states she did not hit her head, patient denies any injuries.  Approximately 5 minutes later patient was sitting at the dining room table and again slumped over passing out for a few seconds.  Both episodes only lasted a few seconds.  Patient denies any pain at this time.  Mother denies any twitching episodes.  Patient denies any symptoms other than a sore throat.  She is generally well.  Denies any recent travels, history of blood clots, coughs, headaches, or possibility of pregnancy.  HPI  Past Medical History:  Diagnosis Date  . Dysphagia     Patient Active Problem List   Diagnosis Date Noted  . Cervical polyp 06/16/2013    Past Surgical History:  Procedure Laterality Date  . EYE SURGERY       OB History   None      Home Medications    Prior to Admission medications   Medication Sig Start Date End Date Taking? Authorizing Provider  acetaminophen (TYLENOL) 325 MG tablet Take 650 mg by mouth every 6 (six) hours as needed for moderate pain. Reported on 01/20/2016    [provider]  Cyanocobalamin (VITAMIN B 12 PO) Take 1 tablet by mouth every other day. Reported on 01/20/2016    [provider]  diclofenac (VOLTAREN) 75 MG EC tablet Take 1 tablet (75 mg total) by mouth 2 (two) times daily. Patient not taking: Reported on 01/20/2016 03/24/15   Waldemar Dickens, MD  Multiple Vitamins-Minerals (MULTIVITAMIN WITH MINERALS) tablet Take 1 tablet by mouth daily.     [provider]  Omega 3 1000 MG CAPS Take by mouth.    [provider]  omeprazole (PRILOSEC) 10 MG capsule Take 10 mg by mouth daily. Reported on 01/20/2016    [provider]  tamsulosin (FLOMAX) 0.4 MG CAPS capsule Take 1 capsule (0.4 mg total) by mouth daily. Patient not taking: Reported on 01/20/2016 03/24/15   Waldemar Dickens, MD  vitamin D, CHOLECALCIFEROL, 400 UNITS tablet Take 800 Units by mouth daily. Reported on 01/20/2016    [provider]    Family History Family History  Problem Relation Age of Onset  . Heart disease Father     Social History Social History   Tobacco Use  . Smoking status: Never Smoker  . Smokeless tobacco: Never Used  Substance Use Topics  . Alcohol use: No  . Drug use: No     Allergies   Patient has no known allergies.   Review of Systems Review of Systems  Constitutional: Negative for chills and fever.  HENT: Negative for ear pain and sore throat.   Eyes: Negative for pain and visual disturbance.  Respiratory: Negative for cough and shortness of breath.   Cardiovascular: Negative for chest pain and palpitations.  Gastrointestinal: Negative for abdominal pain and vomiting.  Genitourinary: Negative for dysuria and hematuria.  Musculoskeletal: Negative for arthralgias and back pain.  Skin: Negative for color change and rash.  Neurological:  Positive for syncope. Negative for seizures.  All other systems reviewed and are negative.    Physical Exam Updated Vital Signs BP 108/65 (BP Location: Left Arm)   Pulse 71   Temp 98.1 F (36.7 C) (Oral)   Resp 18   Ht 5\' 3"  (1.6 m)   Wt 70.3 kg (155 lb)   LMP 01/13/2018   SpO2 99%   BMI 27.46 kg/m   Physical Exam  Constitutional: She is oriented to person, place, and time. She appears well-developed and well-nourished. No distress.  HENT:  Head: Normocephalic and atraumatic.  Eyes: Pupils are equal, round, and reactive to light. Conjunctivae and EOM  are normal. Right eye exhibits no discharge. Left eye exhibits no discharge. No scleral icterus.  Neck: Normal range of motion. Neck supple. No JVD present.  Cardiovascular: Normal rate, regular rhythm, normal heart sounds and intact distal pulses.  No murmur heard. Pulmonary/Chest: Effort normal and breath sounds normal. No stridor. No respiratory distress.  Abdominal: She exhibits no distension.  Musculoskeletal: She exhibits no edema or deformity.  Neurological: She is alert and oriented to person, place, and time. She exhibits normal muscle tone.  Mental Status:  Alert, oriented, thought content appropriate, able to give a coherent history. Speech fluent without evidence of aphasia. Able to follow 2 step commands without difficulty.  Cranial Nerves:  II:  Peripheral visual fields grossly normal, pupils equal, round, reactive to light III,IV, VI: ptosis not present, extra-ocular motions intact bilaterally  V,VII: smile symmetric, facial light touch sensation equal VIII: hearing grossly normal to voice  X: uvula elevates symmetrically  XI: bilateral shoulder shrug symmetric and strong XII: midline tongue extension without fassiculations Motor:  Normal tone. 5/5 in upper and lower extremities bilaterally including strong and equal grip strength and dorsiflexion/plantar flexion Cerebellar: normal finger-to-nose with bilateral upper extremities Gait: normal gait and balance CV: distal pulses palpable throughout    Skin: Skin is warm and dry. She is not diaphoretic.  Psychiatric: She has a normal mood and affect. Her behavior is normal.  Nursing note and vitals reviewed.    ED Treatments / Results  Labs (all labs ordered are listed, but only abnormal results are displayed) Labs Reviewed  URINALYSIS, ROUTINE W REFLEX MICROSCOPIC - Abnormal; Notable for the following components:      Result Value   Specific Gravity, Urine <1.005 (*)    All other components within normal limits    BASIC METABOLIC PANEL - Abnormal; Notable for the following components:   Glucose, Bld 104 (*)    All other components within normal limits  CBC WITH DIFFERENTIAL/PLATELET - Abnormal; Notable for the following components:   Neutro Abs 8.5 (*)    All other components within normal limits  PREGNANCY, URINE    EKG EKG Interpretation  Date/Time:  Tuesday January 15 2018 14:20:14 EDT Ventricular Rate:  91 PR Interval:    QRS Duration: 101 QT Interval:  373 QTC Calculation: 459 R Axis:   18 Text Interpretation:  Sinus rhythm Borderline T wave abnormalities Confirmed by Julianne Rice [phone]) on 01/15/2018 5:45:09 PM   Radiology No results found.  Procedures Procedures (including critical care time)  Medications Ordered in ED Medications - No data to display   Initial Impression / Assessment and Plan / ED Course  I have reviewed the triage vital signs and the nursing notes.  Pertinent labs & imaging results that were available during my care of the patient were reviewed by me and considered in my medical  decision making (see chart for details).    Patient without arrhythmia or tachycardia while here in the department.  Patient without history of congestive heart failure, normal hematocrit, normal ECG, no shortness of breath and systolic blood pressure greater than 90; patient is low risk. Will plan for discharge home with close PCP follow-up.  Possibility of recurrent syncope has been discussed. I discussed reasons to avoid driving until cardiology/PCP followup and other safety prevention including use of ladders and working at heights.   Pt has remained hemodynamically stable throughout their time in the ED  BP 108/65 (BP Location: Left Arm)   Pulse 71   Temp 98.1 F (36.7 C) (Oral)   Resp 18   Ht 5\' 3"  (1.6 m)   Wt 70.3 kg (155 lb)   LMP 01/13/2018   SpO2 99%   BMI 27.46 kg/m     Final Clinical Impressions(s) / ED Diagnoses   Final diagnoses:  Syncope and collapse     ED Discharge Orders    None       Ollen Gross 01/15/18 1841    Julianne Rice, MD 01/15/18 2337

## 2018-01-15 NOTE — ED Notes (Signed)
Reports 2 episodes of LOC lasting "a few seconds".  Family member reports first episode patient was standing at the refrigerator and felt to the ground.  Denies any injury.  Second episode patient was sitting at the dining room table an "passed out for a second".

## 2018-01-15 NOTE — Discharge Instructions (Signed)
Please make sure you are staying well hydrated.  Please make sure you are eating healthy.  Please use caution when performing any activities that would cause harm if you were to pass out including swimming, working at heights or other dangerous activities.

## 2018-01-17 ENCOUNTER — Other Ambulatory Visit: Payer: Self-pay

## 2018-01-17 ENCOUNTER — Emergency Department (HOSPITAL_BASED_OUTPATIENT_CLINIC_OR_DEPARTMENT_OTHER): Payer: Self-pay

## 2018-01-17 ENCOUNTER — Encounter (HOSPITAL_BASED_OUTPATIENT_CLINIC_OR_DEPARTMENT_OTHER): Payer: Self-pay | Admitting: Emergency Medicine

## 2018-01-17 ENCOUNTER — Observation Stay (HOSPITAL_BASED_OUTPATIENT_CLINIC_OR_DEPARTMENT_OTHER)
Admission: EM | Admit: 2018-01-17 | Discharge: 2018-01-19 | Disposition: A | Discharge status: Home / Self Care | Payer: Self-pay | Attending: Internal Medicine | Admitting: Internal Medicine

## 2018-01-17 DIAGNOSIS — Z8249 Family history of ischemic heart disease and other diseases of the circulatory system: Secondary | ICD-10-CM | POA: Insufficient documentation

## 2018-01-17 DIAGNOSIS — R1310 Dysphagia, unspecified: Secondary | ICD-10-CM | POA: Insufficient documentation

## 2018-01-17 DIAGNOSIS — K219 Gastro-esophageal reflux disease without esophagitis: Secondary | ICD-10-CM | POA: Insufficient documentation

## 2018-01-17 DIAGNOSIS — R55 Syncope and collapse: Principal | ICD-10-CM | POA: Diagnosis present

## 2018-01-17 DIAGNOSIS — R002 Palpitations: Secondary | ICD-10-CM | POA: Insufficient documentation

## 2018-01-17 DIAGNOSIS — I471 Supraventricular tachycardia: Secondary | ICD-10-CM | POA: Insufficient documentation

## 2018-01-17 DIAGNOSIS — R000 Tachycardia, unspecified: Secondary | ICD-10-CM

## 2018-01-17 DIAGNOSIS — D72829 Elevated white blood cell count, unspecified: Secondary | ICD-10-CM | POA: Insufficient documentation

## 2018-01-17 HISTORY — DX: Gastro-esophageal reflux disease without esophagitis: K21.9

## 2018-01-17 LAB — BASIC METABOLIC PANEL
ANION GAP: 7 (ref 5–15)
BUN: 7 mg/dL (ref 6–20)
CO2: 27 mmol/L (ref 22–32)
Calcium: 9.1 mg/dL (ref 8.9–10.3)
Chloride: 106 mmol/L (ref 98–111)
Creatinine, Ser: 0.6 mg/dL (ref 0.44–1.00)
GFR calc Af Amer: >60 mL/min (ref >60)
GFR calc non Af Amer: >60 mL/min (ref >60)
GLUCOSE: 135 mg/dL — AB (ref 70–99)
POTASSIUM: 3.6 mmol/L (ref 3.5–5.1)
Sodium: 140 mmol/L (ref 135–145)

## 2018-01-17 LAB — CBC
HEMATOCRIT: 41.4 % (ref 36.0–46.0)
HEMOGLOBIN: 13.7 g/dL (ref 12.0–15.0)
MCH: 28.5 pg (ref 26.0–34.0)
MCHC: 33.1 g/dL (ref 30.0–36.0)
MCV: 86.3 fL (ref 78.0–100.0)
PLATELETS: 245 10*3/uL (ref 150–400)
RBC: 4.8 MIL/uL (ref 3.87–5.11)
RDW: 13.2 % (ref 11.5–15.5)
WBC: 14.6 10*3/uL — ABNORMAL HIGH (ref 4.0–10.5)

## 2018-01-17 LAB — URINALYSIS, ROUTINE W REFLEX MICROSCOPIC
Bilirubin Urine: NEGATIVE
Glucose, UA: NEGATIVE mg/dL
Hgb urine dipstick: NEGATIVE
Ketones, ur: NEGATIVE mg/dL
Leukocytes, UA: NEGATIVE
NITRITE: NEGATIVE
PROTEIN: NEGATIVE mg/dL
pH: 7 (ref 5.0–8.0)

## 2018-01-17 LAB — CBG MONITORING, ED: GLUCOSE-CAPILLARY: 121 mg/dL — AB (ref 70–99)

## 2018-01-17 LAB — D-DIMER, QUANTITATIVE: D-Dimer, Quant: <0.27 ug/mL-FEU (ref 0.00–0.50)

## 2018-01-17 LAB — PREGNANCY, URINE: PREG TEST UR: NEGATIVE

## 2018-01-17 NOTE — H&P (Signed)
History and Physical    [name] ALP:[id number] DOB: 14-Jan-1981 DOA: 01/17/2018  Referring MD/NP/PA:   PCP: Patient, No Pcp Per   Patient coming from:  The patient is coming from home.  At baseline, pt is independent for most of ADL.   Chief Complaint: syncope and palpitation   HPI: Holly Bates is a 37 y.o. female with medical history significant of GERD, dysphagia, who presents with syncope and palpitation.  Patient had syncope 2 days ago, and was evaluated in the ED with negative work-up.  Today patient states that she had multiple episodes of loss of consciousness, more than 10 times. Each time lasted for few seconds. No jerky movement. She has woozy feeling. Reports palpitation and heart racing. Per RN report, pt had possible SVT with HR up to 165 in ED. On EKG, seems to be sinus tachycardia, QTC 496 with heart rate 134.  Patient denies any chest pain, shortness of breath, cough, fever or chills.  No nausea, vomiting, diarrhea, abdominal pain, symptoms of UTI.  Denies unilateral weakness, numbness or tingling in extremities.  No vision change or hearing loss.  No facial droop or slurred speech.  Patient states that she had trip to [place] by cruise in March, but no recent long distance traveling since then.  No tenderness in the calf areas.  ED Course: pt was found to have negative d-dimer, negative pregnancy test, negative urinalysis, WBC 14.6, electrolytes renal function okay, temperature normal, no tachypnea, oxygen saturation 98% on room air, negative chest x-ray, blood pressure 135/79, tachycardia.  Patient is placed on telemetry bed for observation.  Review of Systems:   General: no fevers, chills, no body weight gain, has fatigue HEENT: no blurry vision, hearing changes or sore throat Respiratory: no dyspnea, coughing, wheezing CV: no chest pain, had palpitations GI: no nausea, vomiting, abdominal pain, diarrhea, constipation GU: no dysuria, burning on urination,  increased urinary frequency, hematuria  Ext: no leg edema Neuro: no unilateral weakness, numbness, or tingling, no vision change or hearing loss. Has syncope. Skin: no rash, no skin tear. MSK: No muscle spasm, no deformity, no limitation of range of movement in spin Heme: No easy bruising.  Travel history: No recent long distant travel.  Allergy: No Known Allergies  Past Medical History:  Diagnosis Date  . Dysphagia   . GERD (gastroesophageal reflux disease)     Past Surgical History:  Procedure Laterality Date  . EYE SURGERY      Social History:  reports that she has never smoked. She has never used smokeless tobacco. She reports that she does not drink alcohol or use drugs.  Family History:  Family History  Problem Relation Age of Onset  . Heart disease Father      Prior to Admission medications   Medication Sig Start Date End Date Taking? Authorizing Provider  acetaminophen (TYLENOL) 325 MG tablet Take 650 mg by mouth every 6 (six) hours as needed for moderate pain. Reported on 01/20/2016    [provider]  Cyanocobalamin (VITAMIN B 12 PO) Take 1 tablet by mouth every other day. Reported on 01/20/2016    [provider]  diclofenac (VOLTAREN) 75 MG EC tablet Take 1 tablet (75 mg total) by mouth 2 (two) times daily. Patient not taking: Reported on 01/20/2016 03/24/15   Waldemar Dickens, MD  Multiple Vitamins-Minerals (MULTIVITAMIN WITH MINERALS) tablet Take 1 tablet by mouth daily.    [provider]  Omega 3 1000 MG CAPS Take by mouth.  [provider]  omeprazole (PRILOSEC) 10 MG capsule Take 10 mg by mouth daily. Reported on 01/20/2016    [provider]  tamsulosin (FLOMAX) 0.4 MG CAPS capsule Take 1 capsule (0.4 mg total) by mouth daily. Patient not taking: Reported on 01/20/2016 03/24/15   Waldemar Dickens, MD  vitamin D, CHOLECALCIFEROL, 400 UNITS tablet Take 800 Units by mouth daily. Reported on 01/20/2016    [provider]    Physical Exam: Vitals:   01/17/18 2130 01/17/18 2200 01/17/18 2345 01/18/18 0044  BP: 130/71 131/81 (!) 148/89 136/79  Pulse: 80 92 94 84  Resp: 15 (!) 24 18 18   Temp:   98 F (36.7 C) 98.5 F (36.9 C)  TempSrc:   Oral Oral  SpO2: 100% 93% 97% 98%  Weight:      Height:       General: Not in acute distress HEENT:       Eyes: PERRL, EOMI, no scleral icterus.       ENT: No discharge from the ears and nose, no pharynx injection, no tonsillar enlargement.        Neck: No JVD, no bruit, no mass felt. Heme: No neck lymph node enlargement. Cardiac: S1/S2, RRR, No murmurs, No gallops or rubs. Respiratory: No rales, wheezing, rhonchi or rubs. GI: Soft, nondistended, nontender, no rebound pain, no organomegaly, BS present. GU: No hematuria Ext: No pitting leg edema bilaterally. 2+DP/PT pulse bilaterally. Musculoskeletal: No joint deformities, No joint redness or warmth, no limitation of ROM in spin. Skin: No rashes.  Neuro: Alert, oriented X3, cranial nerves II-XII grossly intact, moves all extremities normally. Muscle strength 5/5 in all extremities, sensation to light touch intact. Brachial reflex 2+ bilaterally. Negative Babinski's sign. Normal finger to nose test. Psych: Patient is not psychotic, no suicidal or hemocidal ideation.  Labs on Admission: I have personally reviewed following labs and imaging studies  CBC: Recent Labs  Lab 01/15/18 1524 01/17/18 1914  WBC 10.4 14.6*  NEUTROABS 8.5*  --   HGB 14.4 13.7  HCT 43.4 41.4  MCV 86.1 86.3  PLT 245 962   Basic Metabolic Panel: Recent Labs  Lab 01/15/18 1524 01/17/18 1914  NA 141 140  K 3.6 3.6  CL 107 106  CO2 27 27  GLUCOSE 104* 135*  BUN 10 7  CREATININE 0.66 0.60  CALCIUM 9.1 9.1   GFR: Estimated Creatinine Clearance: 92.3 mL/min (by C-G formula based on SCr of 0.6 mg/dL). Liver Function Tests: No results for input(s): AST, ALT, ALKPHOS, BILITOT, PROT, ALBUMIN in the last 168 hours. No  results for input(s): LIPASE, AMYLASE in the last 168 hours. No results for input(s): AMMONIA in the last 168 hours. Coagulation Profile: No results for input(s): INR, PROTIME in the last 168 hours. Cardiac Enzymes: No results for input(s): CKTOTAL, CKMB, CKMBINDEX, TROPONINI in the last 168 hours. BNP (last 3 results) No results for input(s): PROBNP in the last 8760 hours. HbA1C: No results for input(s): HGBA1C in the last 72 hours. CBG: Recent Labs  Lab 01/17/18 1911  GLUCAP 121*   Lipid Profile: No results for input(s): CHOL, HDL, LDLCALC, TRIG, CHOLHDL, LDLDIRECT in the last 72 hours. Thyroid Function Tests: No results for input(s): TSH, T4TOTAL, FREET4, T3FREE, THYROIDAB in the last 72 hours. Anemia Panel: No results for input(s): VITAMINB12, FOLATE, FERRITIN, TIBC, IRON, RETICCTPCT in the last 72 hours. Urine analysis:    Component Value Date/Time   COLORURINE YELLOW 01/17/2018 1943   APPEARANCEUR CLEAR 01/17/2018 1943  LABSPEC <1.005 (L) 01/17/2018 1943   PHURINE 7.0 01/17/2018 1943   GLUCOSEU NEGATIVE 01/17/2018 [place] 01/17/2018 [place] 01/17/2018 [place] 01/17/2018 1943   PROTEINUR NEGATIVE 01/17/2018 1943   UROBILINOGEN 0.2 03/24/2015 1327   NITRITE NEGATIVE 01/17/2018 [place] 01/17/2018 1943   Sepsis Labs: @LABRCNTIP (procalcitonin:4,lacticidven:4) )No results found for this or any previous visit (from the past 240 hour(s)).   Radiological Exams on Admission: Dg Chest 2 View  Result Date: 01/17/2018 CLINICAL DATA:  Heart palpitations.  Multiple syncopal episodes. EXAM: CHEST - 2 VIEW COMPARISON:  Abdominal serious, 05/31/2014 FINDINGS: Cardiomediastinal silhouette is normal. Mediastinal contours appear intact. There is no evidence of focal airspace consolidation, pleural effusion or pneumothorax. Osseous structures are without acute abnormality. Soft tissues are grossly normal.  IMPRESSION: No active cardiopulmonary disease. Electronically Signed   By: Fidela Salisbury M.D.   On: 01/17/2018 20:45     EKG: Independently reviewed.  Seems to be sinus rhythm, tachycardia, heart rate 134, QTC 496, nonspecific T wave change.  Assessment/Plan Principal Problem:   Syncope Active Problems:   GERD (gastroesophageal reflux disease)   Palpitation   Leukocytosis   Syncope: Etiology is not clear.  Differential diagnosis include cardiac arrhythmia, possible SVT as reported by nurse, panic disorder, seizure, other intracranial abnormalities.  No focal neurologic findings on physical examination.  Currently no chest pain or shortness of breath.  No oxygen desaturation or signs of DVT, less likely to have PE given negative d-dimer.   - Place on tele bed for obs - Orthostatic vital signs  - MRI-brain - EEG - 2d echo - UDS - Neuro checks  - IVF: NS 125 cc/h - prn xanax for anxiety   GERD (gastroesophageal reflux disease):  -prn protonix  Palpitation: Etiology is not clear.  Patient may have SVT as reported by the nurse. -TSH, free T4, free T3 -2D echo -Troponin x3 - f/u UDS -prn metoprolol 5 mg by IV for heart rate>120  Leukocytosis: no signs of infection. UA negative. CXR negative. Likely due to stress induced to demargination.  -will follow up blood and UA -follow up by CBC   DVT ppx: SQ Lovenox Code Status: Full code Family Communication: None at bed side.   Disposition Plan:  Anticipate discharge back to previous home environment Consults called:  none Admission status: Obs / tele      Date of Service 01/18/2018    Ivor Costa [facility] Pager [phone]  If 7PM-7AM, please contact night-coverage [email or web address] Password [email or web address] 01/18/2018, 2:42 AM

## 2018-01-17 NOTE — ED Notes (Signed)
Family advised that pt had another syncopal episode while lying in the bed. EKG hx reviewed and no arrhythmia noted at the time the family stated. Pt is A/Ox4 upon this RN's arrival to bedside.

## 2018-01-17 NOTE — ED Notes (Signed)
Pt's family called out again to states the pt had another episode. Pt's sister describes it as just a "head nod" and a look of discomfort. Pt reports some palpitations. This RN noted SVT on the monitor at 165. Pt was asked to blow through a syringe. Pt's HR reduced to 115. EDP notified.

## 2018-01-17 NOTE — ED Provider Notes (Signed)
[facility] EMERGENCY DEPARTMENT Provider Note   CSN: [id number] Arrival date & time: 01/17/18  3474     History   Chief Complaint Chief Complaint  Patient presents with  . Loss of Consciousness    HPI Holly Bates is a 37 y.o. female.  Patient seen July 9 for syncopal episode.  Monitored without any cardiac arrhythmias labs without any significant abnormalities.  D-dimer not checked.  Patient was fine yesterday but then again today started to have multiple episodes of rapid heart rate and feeling of palpitations and gets lightheaded and either has near syncopal or syncopal episode.  Highest heart rate on monitor was 165.  Heart rate reduced down to 115 then patient's heart rates been elevated for the most part since she is been here today upper 90s or low 100s.  Has had several episodes of the fast heart rate going up to 140s or 150.  Not captured but at 135 it was a sinus tach.  Seen on monitor by me suggestive of more sinus tach than SVT.  But not confirmatory.  No seizure-like activity when the events occur there is no hypoxia.  The syncopal episodes are very brief.     Past Medical History:  Diagnosis Date  . Dysphagia     Patient Active Problem List   Diagnosis Date Noted  . Cervical polyp 06/16/2013    Past Surgical History:  Procedure Laterality Date  . EYE SURGERY       OB History   None      Home Medications    Prior to Admission medications   Medication Sig Start Date End Date Taking? Authorizing Provider  acetaminophen (TYLENOL) 325 MG tablet Take 650 mg by mouth every 6 (six) hours as needed for moderate pain. Reported on 01/20/2016    [provider]  Cyanocobalamin (VITAMIN B 12 PO) Take 1 tablet by mouth every other day. Reported on 01/20/2016    [provider]  diclofenac (VOLTAREN) 75 MG EC tablet Take 1 tablet (75 mg total) by mouth 2 (two) times daily. Patient not taking: Reported on 01/20/2016 03/24/15   Waldemar Dickens, MD  Multiple Vitamins-Minerals (MULTIVITAMIN WITH MINERALS) tablet Take 1 tablet by mouth daily.    [provider]  Omega 3 1000 MG CAPS Take by mouth.    [provider]  omeprazole (PRILOSEC) 10 MG capsule Take 10 mg by mouth daily. Reported on 01/20/2016    [provider]  tamsulosin (FLOMAX) 0.4 MG CAPS capsule Take 1 capsule (0.4 mg total) by mouth daily. Patient not taking: Reported on 01/20/2016 03/24/15   Waldemar Dickens, MD  vitamin D, CHOLECALCIFEROL, 400 UNITS tablet Take 800 Units by mouth daily. Reported on 01/20/2016    [provider]    Family History Family History  Problem Relation Age of Onset  . Heart disease Father     Social History Social History   Tobacco Use  . Smoking status: Never Smoker  . Smokeless tobacco: Never Used  Substance Use Topics  . Alcohol use: No  . Drug use: No     Allergies   Patient has no known allergies.   Review of Systems Review of Systems  Constitutional: Negative for fatigue and fever.  HENT: Negative for congestion.   Eyes: Negative for visual disturbance.  Respiratory: Negative for shortness of breath.   Cardiovascular: Negative for chest pain.  Gastrointestinal: Negative for abdominal pain, nausea and vomiting.  Genitourinary: Negative for dysuria.  Musculoskeletal: Negative for back pain and neck pain.  Skin: Negative for rash.  Neurological: Positive for syncope and light-headedness.  Hematological: Does not bruise/bleed easily.  Psychiatric/Behavioral: Negative for confusion.     Physical Exam Updated Vital Signs BP 135/79   Pulse (!) 111   Temp 98.3 F (36.8 C) (Oral)   Resp 19   Ht 1.6 m (5\' 3" )   Wt 70.3 kg (155 lb)   LMP 01/13/2018   SpO2 99%   BMI 27.46 kg/m   Physical Exam  Constitutional: She is oriented to person, place, and time. She appears well-developed and well-nourished. No distress.  HENT:  Head: Normocephalic and atraumatic.    Mouth/Throat: Oropharynx is clear and moist.  Eyes: Pupils are equal, round, and reactive to light. Conjunctivae and EOM are normal.  Neck: Normal range of motion. Neck supple.  Cardiovascular: Normal rate, regular rhythm and normal heart sounds.  Pulmonary/Chest: Effort normal and breath sounds normal. No respiratory distress.  Abdominal: Soft. Bowel sounds are normal. There is no tenderness.  Musculoskeletal: Normal range of motion.  Neurological: She is alert and oriented to person, place, and time. No cranial nerve deficit or sensory deficit. Coordination normal.  Skin: Skin is warm. No rash noted.  Nursing note and vitals reviewed.    ED Treatments / Results  Labs (all labs ordered are listed, but only abnormal results are displayed) Labs Reviewed  BASIC METABOLIC PANEL - Abnormal; Notable for the following components:      Result Value   Glucose, Bld 135 (*)    All other components within normal limits  CBC - Abnormal; Notable for the following components:   WBC 14.6 (*)    All other components within normal limits  URINALYSIS, ROUTINE W REFLEX MICROSCOPIC - Abnormal; Notable for the following components:   Specific Gravity, Urine <1.005 (*)    All other components within normal limits  CBG MONITORING, ED - Abnormal; Notable for the following components:   Glucose-Capillary 121 (*)    All other components within normal limits  PREGNANCY, URINE  D-DIMER, QUANTITATIVE (NOT AT [facility])    EKG EKG Interpretation  Date/Time:  Thursday January 17 2018 20:03:15 EDT Ventricular Rate:  134 PR Interval:    QRS Duration: 97 QT Interval:  332 QTC Calculation: 496 R Axis:   39 Text Interpretation:  Sinus tachycardia Borderline repolarization abnormality Prolonged QT interval Confirmed by Fredia Sorrow [phone]) on 01/17/2018 8:12:27 PM   Radiology Dg Chest 2 View  Result Date: 01/17/2018 CLINICAL DATA:  Heart palpitations.  Multiple syncopal episodes. EXAM: CHEST - 2 VIEW  COMPARISON:  Abdominal serious, 05/31/2014 FINDINGS: Cardiomediastinal silhouette is normal. Mediastinal contours appear intact. There is no evidence of focal airspace consolidation, pleural effusion or pneumothorax. Osseous structures are without acute abnormality. Soft tissues are grossly normal. IMPRESSION: No active cardiopulmonary disease. Electronically Signed   By: Fidela Salisbury M.D.   On: 01/17/2018 20:45    Procedures Procedures (including critical care time)  Medications Ordered in ED Medications - No data to display   Initial Impression / Assessment and Plan / ED Course  I have reviewed the triage vital signs and the nursing notes.  Pertinent labs & imaging results that were available during my care of the patient were reviewed by me and considered in my medical decision making (see chart for details).     Discussed with hospitalist.  Based on the recurrence of these episodes and multiple episodes of near syncope or syncope today recommending admission  cardiac monitoring may be cardiology evaluation may be of an echocardiogram.  Patient may be going into SVT but would have expected quicker conversion her heart rate just naturally slows down from the upper 140s or 150s down to about 120.  Patient states that the onset is with the feeling of a rapid heart rate and then she gets lightheaded and feels she is going to pass out sometimes she does pass out.  EKG here today without significant change from before other than the fact that she has a prolonged QT.  Work-up chest x-ray negative basic labs without significant abnormality other than some leukocytosis.  Pregnancy test negative d-dimer negative.  Does not seem to represent seizures several witnessed by [place] here.   it is possible that it could be psychosomatic in nature but it would be hard for her to generate such a rapid heart rate.  Evaluation monitoring required.  Patient will be admitted to telemetry [facility] discussed with the  [facility] hospitalist.  No medication provided here episodes are very brief.  Final Clinical Impressions(s) / ED Diagnoses   Final diagnoses:  Syncope, unspecified syncope type  Palpitations    ED Discharge Orders    None       Fredia Sorrow, MD 01/17/18 2253

## 2018-01-17 NOTE — ED Triage Notes (Signed)
Per family, pt was seen 2 days ago ref several episodes of syncope. Nothing acute was found in the testing. She continues to have several brief episodes of LOC. Pt c/o "feeling woozy". Denies pain.

## 2018-01-17 NOTE — ED Notes (Signed)
ED Provider at bedside. 

## 2018-01-17 NOTE — Care Management (Signed)
  This is a no charge note  Transfer from [facility] per Dr. Rogene Houston  37 year old lady with past medical history of GERD, dysphagia, who presents with recurrent syncope.  Patient had syncope 2 days ago, and was evaluated in the ED with negative work-up.  Today patient had multiple episodes of loss of consciousness, feeling woozy. Also has palpitation. Per RN report, pt had possible SVT with HR up to 165. On EKG, seems to be sinus tachycardia, QTC 496 with heart rate 134.   Patient was found to have negative d-dimer, negative pregnancy test, negative urinalysis, WBC 14.6, electrolytes renal function okay, temperature normal, no tachypnea, oxygen saturation 98% on room air, negative chest x-ray, blood pressure 135/79, tachycardia.  Patient is placed on telemetry bed for observation.   Please call manager of [facility] at [phone] when pt arrives to floor   Ivor Costa, MD  [facility] Pager [phone]  If 7PM-7AM, please contact night-coverage [email or web address] Password [facility] 01/17/2018, 10:21 PM

## 2018-01-18 ENCOUNTER — Other Ambulatory Visit: Payer: Self-pay

## 2018-01-18 ENCOUNTER — Observation Stay (HOSPITAL_BASED_OUTPATIENT_CLINIC_OR_DEPARTMENT_OTHER): Payer: Self-pay

## 2018-01-18 ENCOUNTER — Observation Stay (HOSPITAL_COMMUNITY): Payer: Self-pay

## 2018-01-18 ENCOUNTER — Other Ambulatory Visit: Payer: Self-pay | Admitting: Cardiology

## 2018-01-18 ENCOUNTER — Encounter (HOSPITAL_COMMUNITY): Payer: Self-pay | Admitting: *Deleted

## 2018-01-18 DIAGNOSIS — I361 Nonrheumatic tricuspid (valve) insufficiency: Secondary | ICD-10-CM

## 2018-01-18 DIAGNOSIS — R002 Palpitations: Secondary | ICD-10-CM

## 2018-01-18 DIAGNOSIS — K219 Gastro-esophageal reflux disease without esophagitis: Secondary | ICD-10-CM

## 2018-01-18 DIAGNOSIS — D72829 Elevated white blood cell count, unspecified: Secondary | ICD-10-CM

## 2018-01-18 DIAGNOSIS — R55 Syncope and collapse: Secondary | ICD-10-CM

## 2018-01-18 DIAGNOSIS — R000 Tachycardia, unspecified: Secondary | ICD-10-CM

## 2018-01-18 LAB — BASIC METABOLIC PANEL
Anion gap: 10 (ref 5–15)
CHLORIDE: 108 mmol/L (ref 98–111)
CO2: 25 mmol/L (ref 22–32)
Calcium: 8.7 mg/dL — ABNORMAL LOW (ref 8.9–10.3)
Creatinine, Ser: 0.58 mg/dL (ref 0.44–1.00)
GFR calc Af Amer: >60 mL/min (ref >60)
GLUCOSE: 97 mg/dL (ref 70–99)
POTASSIUM: 3.4 mmol/L — AB (ref 3.5–5.1)
Sodium: 143 mmol/L (ref 135–145)

## 2018-01-18 LAB — RAPID URINE DRUG SCREEN, HOSP PERFORMED
Amphetamines: NOT DETECTED
Benzodiazepines: NOT DETECTED
Cocaine: NOT DETECTED
Opiates: NOT DETECTED
TETRAHYDROCANNABINOL: NOT DETECTED

## 2018-01-18 LAB — ECHOCARDIOGRAM COMPLETE
Height: 63 in
WEIGHTICAEL: 2547.2 [oz_av]

## 2018-01-18 LAB — T4, FREE: FREE T4: 1.02 ng/dL (ref 0.82–1.77)

## 2018-01-18 LAB — TSH: TSH: 1.564 u[IU]/mL (ref 0.350–4.500)

## 2018-01-18 LAB — CBC
HEMATOCRIT: 38.6 % (ref 36.0–46.0)
HEMOGLOBIN: 12.3 g/dL (ref 12.0–15.0)
MCH: 28 pg (ref 26.0–34.0)
MCHC: 31.9 g/dL (ref 30.0–36.0)
MCV: 87.9 fL (ref 78.0–100.0)
Platelets: 214 10*3/uL (ref 150–400)
RBC: 4.39 MIL/uL (ref 3.87–5.11)
RDW: 12.8 % (ref 11.5–15.5)
WBC: 8.4 10*3/uL (ref 4.0–10.5)

## 2018-01-18 LAB — GLUCOSE, CAPILLARY: Glucose-Capillary: 97 mg/dL (ref 70–99)

## 2018-01-18 LAB — TROPONIN I: Troponin I: <0.03 ng/mL (ref <0.03)

## 2018-01-18 LAB — HIV ANTIBODY (ROUTINE TESTING W REFLEX): HIV SCREEN 4TH GENERATION: NONREACTIVE

## 2018-01-18 MED ORDER — ACETAMINOPHEN 325 MG PO TABS: 650.0000 mg | ORAL_TABLET | Freq: Four times a day (QID) | ORAL | Status: DC | PRN

## 2018-01-18 MED ORDER — ALPRAZOLAM 0.25 MG PO TABS: 0.2500 mg | ORAL_TABLET | Freq: Three times a day (TID) | ORAL | Status: DC | PRN

## 2018-01-18 MED ORDER — ZOLPIDEM TARTRATE 5 MG PO TABS: 5.0000 mg | ORAL_TABLET | Freq: Every evening | ORAL | Status: DC | PRN

## 2018-01-18 MED ORDER — POLYETHYLENE GLYCOL 3350 17 G PO PACK: 17.0000 g | PACK | Freq: Every day | ORAL | Status: DC | PRN

## 2018-01-18 MED ORDER — VITAMIN B-12 100 MCG PO TABS
100.0000 ug | ORAL_TABLET | ORAL | Status: DC
  Administered 2018-01-18: 100 ug via ORAL
  Filled 2018-01-18: qty 1

## 2018-01-18 MED ORDER — ADULT MULTIVITAMIN W/MINERALS CH
1.0000 | ORAL_TABLET | Freq: Every day | ORAL | Status: DC
  Administered 2018-01-18 – 2018-01-19 (×2): 1 via ORAL
  Filled 2018-01-18 (×2): qty 1

## 2018-01-18 MED ORDER — POTASSIUM CHLORIDE CRYS ER 20 MEQ PO TBCR
40.0000 meq | EXTENDED_RELEASE_TABLET | Freq: Once | ORAL | Status: AC
  Administered 2018-01-18: 40 meq via ORAL
  Filled 2018-01-18: qty 2

## 2018-01-18 MED ORDER — SODIUM CHLORIDE 0.9 % IV BOLUS: 250.0000 mL | Freq: Once | INTRAVENOUS | Status: DC

## 2018-01-18 MED ORDER — CHOLECALCIFEROL 10 MCG (400 UNIT) PO TABS
800.0000 [IU] | ORAL_TABLET | Freq: Every day | ORAL | Status: DC
  Administered 2018-01-18 – 2018-01-19 (×2): 800 [IU] via ORAL
  Filled 2018-01-18 (×2): qty 2

## 2018-01-18 MED ORDER — SODIUM CHLORIDE 0.9% FLUSH
3.0000 mL | Freq: Two times a day (BID) | INTRAVENOUS | Status: DC
  Administered 2018-01-18: 3 mL via INTRAVENOUS

## 2018-01-18 MED ORDER — PANTOPRAZOLE SODIUM 40 MG PO TBEC: 40.0000 mg | DELAYED_RELEASE_TABLET | Freq: Every day | ORAL | Status: DC | PRN

## 2018-01-18 MED ORDER — SODIUM CHLORIDE 0.9 % IV BOLUS
500.0000 mL | Freq: Once | INTRAVENOUS | Status: AC
  Administered 2018-01-18: 500 mL via INTRAVENOUS

## 2018-01-18 MED ORDER — SODIUM CHLORIDE 0.9 % IV SOLN
INTRAVENOUS | Status: DC
  Administered 2018-01-18 – 2018-01-19 (×4): via INTRAVENOUS

## 2018-01-18 MED ORDER — METOPROLOL TARTRATE 25 MG PO TABS
25.0000 mg | ORAL_TABLET | Freq: Two times a day (BID) | ORAL | Status: DC
  Administered 2018-01-18 – 2018-01-19 (×2): 25 mg via ORAL
  Filled 2018-01-18 (×2): qty 1

## 2018-01-18 MED ORDER — METOPROLOL TARTRATE 5 MG/5ML IV SOLN: 5.0000 mg | INTRAVENOUS | Status: DC | PRN

## 2018-01-18 MED ORDER — ONDANSETRON HCL 4 MG/2ML IJ SOLN: 4.0000 mg | Freq: Three times a day (TID) | INTRAMUSCULAR | Status: DC | PRN

## 2018-01-18 MED ORDER — ENOXAPARIN SODIUM 40 MG/0.4ML ~~LOC~~ SOLN
40.0000 mg | SUBCUTANEOUS | Status: DC
  Administered 2018-01-18 – 2018-01-19 (×2): 40 mg via SUBCUTANEOUS
  Filled 2018-01-18 (×2): qty 0.4

## 2018-01-18 NOTE — Progress Notes (Signed)
Attending MD note  Patient was seen, examined,treatment plan was discussed with the PA-S.  I have personally reviewed the clinical findings, lab, imaging studies and management of this patient in detail. I agree with the documentation, as recorded by the PA-S  Patient is 37 year old female with no significant medical history who was admitted to the hospital on 7/11 with several syncopal episodes at home.  First episode was 3 days ago.  She was evaluated in the ED couple days ago and was sent home.  Syncopal episodes recurred and she came back to the ED and was admitted.  Per EDP she has had several syncopal episodes in the ED and there were also concerns for SVT however this was not captured.  She was admitted to the hospital for further work-up  BP 119/78 (BP Location: Left Arm)   Pulse 71   Temp 98.2 F (36.8 C) (Oral)   Resp 18   Ht 5\' 3"  (1.6 m)   Wt 72.2 kg (159 lb 3.2 oz) Comment: scale b  LMP 01/13/2018   SpO2 100%   BMI 28.20 kg/m  On Exam: Gen. exam: Awake, alert, not in any distress Chest: Good air entry bilaterally, no rhonchi or rales CVS: S1-S2 regular, no murmurs Abdomen: Soft, nontender and nondistended Neurology: Non-focal Skin: No rash or lesions  Plan   Recurrent syncopal episode -Unclear etiology, EEG is negative.  MRI, 2D echo, cardiology evaluation is pending.  She did have 2 episodes during my interview in which she slumped over for probably less than a second and recovered on her own. -No postictal features, no incontinence no incontinence, so unlikely seizure especially when lasting for few seconds only -UDS pending -TSH within normal limits   Rest as below  Holly Remo M. Cruzita Lederer, MD [facility] [phone]  If 7PM-7AM, please contact night-coverage [email or web address] Password [email or web address]     PROGRESS NOTE   Holly Bates  RDE:[id number] DOB: 1981-05-20 DOA: 01/17/2018 PCP: Patient, No Pcp Per  Outpatient Specialists:   Brief Narrative:  37 yo  female with a PMH of GERD, presents to [facility] with syncope. ED witnessed numerous syncopal episodes and associated tachycardia not consistent with seizure disorder. ED physician also noted SVT; however, it was not captured. Admitted for further workup of etiology of syncopal episodes. Episodes only last 1-2 seconds with no postictal period. Cardiology pending consult. Recent EKG showed normal sinus rhythm with sinus arrhythmia. CXR showed no active cardiopulmonary disease. D-dimer was negative. TSH within normal range at 1.564. Negative pregnancy test.   Assessment & Plan:   Principal Problem:   Syncope Active Problems:   GERD (gastroesophageal reflux disease)   Palpitation   Leukocytosis   Syncope w/ associated palpitations -cardiology consult pending. Monitoring on telemetry.  -EEG is normal -MRI ordered -UDS pending results -orthostatics obtained - HR increased from 91 to 124 sitting to standing. Patient experienced "syncope" while standing during the exam. Receiving 0.9% sodium chloride continuous infusion -metoprolol 5 mg Q3H. HR and BP under control. Tachycardic during "syncopal" episodes.   GERD  -continue taking prilosec 10 mg prn for symptoms   DVT prophylaxis: Lovenox Code Status: full  Family Communication: no family was present at bedside during exam Disposition Plan: pending results of EEG, MRI and echo  Consultants:   Cardiology  Procedures:   EEG  Antimicrobials:   None  Subjective: Patient was sitting upright in her hospital bed, alert and oriented to person, place, time. During interview, 2 "syncopal" episodes were witnessed during which patient became tachycardic  in the 120s. Endorses approximately 12 "syncopal" episodes since Tuesday, occurring when she is lying down, sitting upright, and standing. Denies confusion post-syncope. Remembers the incident. Endorses being able to tell when the episode is about to occur. Reports palpitations and lightheadedness  during the episodes. Denies N/V, diaphoresis, fatigue. Before today's episodes, the last episode occurred late last night when checking orthostatics, during which she did exhibit orthostatic hypotension. Patient endorses she has never had this happen before. PMH is negative for cardiac disease, anxiety, depression, and seizure disorders. Family history includes a sister with hypertension and her father has heart disease and hypertrophic cardiomyopathy that was recently diagnosed in the last few years following a heart attack.   Objective: Vitals:   01/17/18 2345 01/18/18 0044 01/18/18 0435 01/18/18 0454  BP: (!) 148/89 136/79 130/88 131/86  Pulse: 94 84 72 76  Resp: 18 18 18 18   Temp: 98 F (36.7 C) 98.5 F (36.9 C) 98.2 F (36.8 C) 97.9 F (36.6 C)  TempSrc: Oral Oral Oral Oral  SpO2: 97% 98% 99% 99%  Weight: 71.9 kg (158 lb 8 oz)   72.2 kg (159 lb 3.2 oz)  Height: 5\' 3"  (1.6 m)       Intake/Output Summary (Last 24 hours) at 01/18/2018 1107 Last data filed at 01/18/2018 0900 Gross per 24 hour  Intake 1270.97 ml  Output 0 ml  Net 1270.97 ml   Filed Weights   01/17/18 1901 01/17/18 2345 01/18/18 0454  Weight: 70.3 kg (155 lb) 71.9 kg (158 lb 8 oz) 72.2 kg (159 lb 3.2 oz)    Examination:  General exam: Appears calm and comfortable  Respiratory system: Clear to auscultation. Respiratory effort normal. Cardiovascular system: S1 & S2 heard, RRR. No JVD, murmurs, rubs, gallops or clicks. No pedal edema. Gastrointestinal system: Abdomen is nondistended, soft and nontender. No organomegaly or masses felt. Normal bowel sounds heard. Central nervous system: Alert and oriented. No focal neurological deficits. Extremities: Symmetric 5 x 5 power. Skin: No rashes, lesions or ulcers Psychiatry: Judgement and insight appear normal. Mood & affect appropriate.  Neurologic system:  2 witnessed syncopal episodes lasting 1-2 seconds.    Data Reviewed: I have personally reviewed following labs  and imaging studies  CBC: Recent Labs  Lab 01/15/18 1524 01/17/18 1914 01/18/18 0618  WBC 10.4 14.6* 8.4  NEUTROABS 8.5*  --   --   HGB 14.4 13.7 12.3  HCT 43.4 41.4 38.6  MCV 86.1 86.3 87.9  PLT 245 245 762   Basic Metabolic Panel: Recent Labs  Lab 01/15/18 1524 01/17/18 1914 01/18/18 0618  NA 141 140 143  K 3.6 3.6 3.4*  CL 107 106 108  CO2 27 27 25   GLUCOSE 104* 135* 97  BUN 10 7 <5*  CREATININE 0.66 0.60 0.58  CALCIUM 9.1 9.1 8.7*   GFR: Estimated Creatinine Clearance: 93.4 mL/min (by C-G formula based on SCr of 0.58 mg/dL). Liver Function Tests: No results for input(s): AST, ALT, ALKPHOS, BILITOT, PROT, ALBUMIN in the last 168 hours. No results for input(s): LIPASE, AMYLASE in the last 168 hours. No results for input(s): AMMONIA in the last 168 hours. Coagulation Profile: No results for input(s): INR, PROTIME in the last 168 hours. Cardiac Enzymes: Recent Labs  Lab 01/18/18 0054 01/18/18 0618  TROPONINI <0.03 <0.03   BNP (last 3 results) No results for input(s): PROBNP in the last 8760 hours. HbA1C: No results for input(s): HGBA1C in the last 72 hours. CBG: Recent Labs  Lab 01/17/18 1911  01/18/18 0738  GLUCAP 121* 97   Lipid Profile: No results for input(s): CHOL, HDL, LDLCALC, TRIG, CHOLHDL, LDLDIRECT in the last 72 hours. Thyroid Function Tests: Recent Labs    01/18/18 0054 01/18/18 0618  TSH  --  1.564  FREET4 1.02  --    Anemia Panel: No results for input(s): VITAMINB12, FOLATE, FERRITIN, TIBC, IRON, RETICCTPCT in the last 72 hours. Urine analysis:    Component Value Date/Time   COLORURINE YELLOW 01/17/2018 1943   APPEARANCEUR CLEAR 01/17/2018 1943   LABSPEC <1.005 (L) 01/17/2018 1943   PHURINE 7.0 01/17/2018 1943   GLUCOSEU NEGATIVE 01/17/2018 [place] NEGATIVE 01/17/2018 [place] 01/17/2018 1943   KETONESUR NEGATIVE 01/17/2018 1943   PROTEINUR NEGATIVE 01/17/2018 1943   UROBILINOGEN 0.2 03/24/2015 1327     NITRITE NEGATIVE 01/17/2018 1943   LEUKOCYTESUR NEGATIVE 01/17/2018 1943   Sepsis Labs: @LABRCNTIP (procalcitonin:4,lacticidven:4)  )No results found for this or any previous visit (from the past 240 hour(s)).       Radiology Studies: Dg Chest 2 View  Result Date: 01/17/2018 CLINICAL DATA:  Heart palpitations.  Multiple syncopal episodes. EXAM: CHEST - 2 VIEW COMPARISON:  Abdominal serious, 05/31/2014 FINDINGS: Cardiomediastinal silhouette is normal. Mediastinal contours appear intact. There is no evidence of focal airspace consolidation, pleural effusion or pneumothorax. Osseous structures are without acute abnormality. Soft tissues are grossly normal. IMPRESSION: No active cardiopulmonary disease. Electronically Signed   By: Fidela Salisbury M.D.   On: 01/17/2018 20:45        Scheduled Meds: . cholecalciferol  800 Units Oral Daily  . enoxaparin (LOVENOX) injection  40 mg Subcutaneous Q24H  . multivitamin with minerals  1 tablet Oral Daily  . sodium chloride flush  3 mL Intravenous Q12H  . vitamin B-12  100 mcg Oral QODAY   Continuous Infusions: . sodium chloride 125 mL/hr at 01/18/18 0438     LOS: 0 days    Marney Setting, PA-S Marzetta Board MD [facility] Pager [phone]  If 7PM-7AM, please contact night-coverage [email or web address] Password [email or web address] 01/18/2018, 11:07 AM

## 2018-01-18 NOTE — Procedures (Signed)
ELECTROENCEPHALOGRAM REPORT  Date of Study: 01/18/2018  Patient's Name: Holly Bates MRN: [id number] Date of Birth: 1981/03/11  Referring Provider: Ivor Costa, MD  Clinical History: 37 y.o. female with medical history significant of GERD, dysphagia, who presents with syncope and palpitation.  Medications: Alprazolam metoprolol  Technical Summary: A multichannel digital EEG recording measured by the international 10-20 system with electrodes applied with paste and impedances below 5000 ohms performed in our laboratory with EKG monitoring in an awake and drowsy patient.  Hyperventilation and photic stimulation were not performed.  The digital EEG was referentially recorded, reformatted, and digitally filtered in a variety of bipolar and referential montages for optimal display.    Description: The patient is awake and drowsy during the recording.  During maximal wakefulness, there is a symmetric 9 Hz posterior dominant rhythm that attenuates with eye opening.  The record is symmetric.  Stage 2 sleep is not seen.  There were no epileptiform discharges or electrographic seizures seen.    EKG lead demonstrates sinus rhythm  Impression: This awake and drowsy EEG is normal.    Clinical Correlation: A normal EEG does not exclude a clinical diagnosis of epilepsy.  If further clinical questions remain, prolonged EEG may be helpful.  Clinical correlation is advised.   Metta Clines, DO

## 2018-01-18 NOTE — Progress Notes (Signed)
  Echocardiogram 2D Echocardiogram has been performed.  Holly Bates 01/18/2018, 1:37 PM

## 2018-01-18 NOTE — Consult Note (Addendum)
Cardiology Consultation:   Patient ID: Rudolph Dobler; [id number]; [birth year]   Admit date: 01/17/2018 Date of Consult: 01/18/2018  Primary Care Provider: Patient, No Pcp Per Primary Cardiologist: New (Dr. Oval Linsey)   Patient Profile:   Holly Bates is a 37 y.o. female with a hx of GERD but no other significant past medical problems but also a family history of HOCM (father s/p septal myectomy) who is being seen today for the evaluation of recurrent syncope and SVT at the request of Dr. Cruzita Lederer, Internal Medicine.  History of Present Illness:   Holly Bates is a 37 y.o. female with a hx of GERD but no other significant past medical problems but also a family history of HOCM (father s/p septal myectomy) who is being seen today for the evaluation of recurrent syncope and SVT at the request of Dr. Cruzita Lederer, Internal Medicine.  Her father notes that he was first diagnosed in his 80s. He had a septal myectomy. No ICD. He is now followed by Dr. Johnsie Cancel. He notes testing + for genetic mutation. Also had an uncle with HCM. Holly Bates has never been tested for genetic mutation and does not typically seek routine preventative care/ not followed by a PCP. Has never been diagnosed with a murmur and no prior history of palpitations, until recently. Denies CP and dyspnea. Does not get much physical activity.  Pt notes several syncopal spells in the last week. Symptoms started 3 days ago. At least 12 syncopal spells, w/ brief LOC lasting only sec at a time. She notes feeling lightheaded/dizzy with tachy palpitations prior to synopsizing. She denies any recent illnesses. No n/v/d, fever or chills. Reports adequate hydration. No new meds or changes in diet. It was noted by EDP that she was observed to have a run of SVT w/ rates into the 160s on tele while in the ED but it was not captured on EKG.   Labs notable for negative d-dimer and pregnancy test, normal UA, normal electrolytes, TSH, CBC and negative  troponin x 2. UDS pending. CXR negative. MRI of brain pending. EEG normal, but does not exclude a clinical diagnosis of epilepsy. Orthostatic VS positive, based on HR, increased > 15 bpm with standing (see below).   Cardiology asked to evaluate. She is currently in NSR on tele but had a brief run of ST on tele with rates in the 120s. Echo shows no signs of HCM. EF normal.   Orthostatic VS for the past 24 hrs:  BP- Lying Pulse- Lying BP- Sitting Pulse- Sitting BP- Standing at 0 minutes Pulse- Standing at 0 minutes  01/18/18 0044 136/79 84 (!) 149/98 91 161/88 117     Past Medical History:  Diagnosis Date  . Dysphagia   . GERD (gastroesophageal reflux disease)     Past Surgical History:  Procedure Laterality Date  . EYE SURGERY       Home Medications:  Prior to Admission medications   Medication Sig Start Date End Date Taking? Authorizing Provider  acetaminophen (TYLENOL) 325 MG tablet Take 650 mg by mouth every 6 (six) hours as needed for moderate pain. Reported on 01/20/2016    [provider]  Cyanocobalamin (VITAMIN B 12 PO) Take 1 tablet by mouth every other day. Reported on 01/20/2016    [provider]  diclofenac (VOLTAREN) 75 MG EC tablet Take 1 tablet (75 mg total) by mouth 2 (two) times daily. Patient not taking: Reported on 01/20/2016 03/24/15   Waldemar Dickens, MD  Multiple Vitamins-Minerals (MULTIVITAMIN WITH  MINERALS) tablet Take 1 tablet by mouth daily.    [provider]  Omega 3 1000 MG CAPS Take by mouth.    [provider]  omeprazole (PRILOSEC) 10 MG capsule Take 10 mg by mouth daily. Reported on 01/20/2016    [provider]  tamsulosin (FLOMAX) 0.4 MG CAPS capsule Take 1 capsule (0.4 mg total) by mouth daily. Patient not taking: Reported on 01/20/2016 03/24/15   Waldemar Dickens, MD  vitamin D, CHOLECALCIFEROL, 400 UNITS tablet Take 800 Units by mouth daily. Reported on 01/20/2016    [provider]    Inpatient  Medications: Scheduled Meds: . cholecalciferol  800 Units Oral Daily  . enoxaparin (LOVENOX) injection  40 mg Subcutaneous Q24H  . multivitamin with minerals  1 tablet Oral Daily  . potassium chloride  40 mEq Oral Once  . sodium chloride flush  3 mL Intravenous Q12H  . vitamin B-12  100 mcg Oral QODAY   Continuous Infusions: . sodium chloride 125 mL/hr at 01/18/18 0438   PRN Meds: acetaminophen, ALPRAZolam, metoprolol tartrate, ondansetron (ZOFRAN) IV, pantoprazole, polyethylene glycol, zolpidem  Allergies:   No Known Allergies  Social History:   Social History   Socioeconomic History  . Marital status: Single    Spouse name: Not on file  . Number of children: Not on file  . Years of education: Not on file  . Highest education level: Not on file  Occupational History  . Not on file  Social Needs  . Financial resource strain: Not on file  . Food insecurity:    Worry: Not on file    Inability: Not on file  . Transportation needs:    Medical: Not on file    Non-medical: Not on file  Tobacco Use  . Smoking status: Never Smoker  . Smokeless tobacco: Never Used  Substance and Sexual Activity  . Alcohol use: No  . Drug use: No  . Sexual activity: Never    Birth control/protection: None  Lifestyle  . Physical activity:    Days per week: Not on file    Minutes per session: Not on file  . Stress: Not on file  Relationships  . Social connections:    Talks on phone: Not on file    Gets together: Not on file    Attends religious service: Not on file    Active member of club or organization: Not on file    Attends meetings of clubs or organizations: Not on file    Relationship status: Not on file  . Intimate partner violence:    Fear of current or ex partner: Not on file    Emotionally abused: Not on file    Physically abused: Not on file    Forced sexual activity: Not on file  Other Topics Concern  . Not on file  Social History Narrative  . Not on file    Family  History:    Family History  Problem Relation Age of Onset  . Heart disease Father   . Hypertrophic cardiomyopathy Father 72       s/p septal myectomy      ROS:  Please see the history of present illness.   All other ROS reviewed and negative.     Physical Exam/Data:   Vitals:   01/18/18 0044 01/18/18 0435 01/18/18 0454 01/18/18 1159  BP: 136/79 130/88 131/86 119/78  Pulse: 84 72 76 71  Resp: 18 18 18    Temp: 98.5 F (36.9 C) 98.2  F (36.8 C) 97.9 F (36.6 C) 98.2 F (36.8 C)  TempSrc: Oral Oral Oral Oral  SpO2: 98% 99% 99% 100%  Weight:   159 lb 3.2 oz (72.2 kg)   Height:        Intake/Output Summary (Last 24 hours) at 01/18/2018 1529 Last data filed at 01/18/2018 0900 Gross per 24 hour  Intake 1270.97 ml  Output 0 ml  Net 1270.97 ml   Filed Weights   01/17/18 1901 01/17/18 2345 01/18/18 0454  Weight: 155 lb (70.3 kg) 158 lb 8 oz (71.9 kg) 159 lb 3.2 oz (72.2 kg)   Body mass index is 28.2 kg/m.  General:  Well nourished, well developed, in no acute distress, looks pale HEENT: normal Lymph: no adenopathy Neck: no JVD Endocrine:  No thryomegaly Vascular: No carotid bruits; FA pulses 2+ bilaterally without bruits  Cardiac:  normal S1, S2; RRR; no murmur  Lungs:  clear to auscultation bilaterally, no wheezing, rhonchi or rales  Abd: soft, nontender, no hepatomegaly  Ext: no edema Musculoskeletal:  No deformities, BUE and BLE strength normal and equal Skin: warm and dry  Neuro:  CNs 2-12 intact, no focal abnormalities noted Psych:  Normal affect   EKG:  The EKG was personally reviewed and demonstrates:  NSR QT/QTc 384/446 ms Telemetry:  Telemetry was personally reviewed and demonstrates:  NSR, Sinus tach 120s on tele  Relevant CV Studies: 2D Echo 01/18/18   Study Conclusions  - Left ventricle: The cavity size was normal. Wall thickness was   normal. Systolic function was normal. The estimated ejection   fraction was in the range of 60% to  65%.  Laboratory Data:  Chemistry Recent Labs  Lab 01/15/18 1524 01/17/18 1914 01/18/18 0618  NA 141 140 143  K 3.6 3.6 3.4*  CL 107 106 108  CO2 27 27 25   GLUCOSE 104* 135* 97  BUN 10 7 <5*  CREATININE 0.66 0.60 0.58  CALCIUM 9.1 9.1 8.7*  GFRNONAA >60 >60 >60  GFRAA >60 >60 >60  ANIONGAP 7 7 10     No results for input(s): PROT, ALBUMIN, AST, ALT, ALKPHOS, BILITOT in the last 168 hours. Hematology Recent Labs  Lab 01/15/18 1524 01/17/18 1914 01/18/18 0618  WBC 10.4 14.6* 8.4  RBC 5.04 4.80 4.39  HGB 14.4 13.7 12.3  HCT 43.4 41.4 38.6  MCV 86.1 86.3 87.9  MCH 28.6 28.5 28.0  MCHC 33.2 33.1 31.9  RDW 13.2 13.2 12.8  PLT 245 245 214   Cardiac Enzymes Recent Labs  Lab 01/18/18 0054 01/18/18 0618 01/18/18 1252  TROPONINI <0.03 <0.03 <0.03   No results for input(s): TROPIPOC in the last 168 hours.  BNPNo results for input(s): BNP, PROBNP in the last 168 hours.  DDimer  Recent Labs  Lab 01/17/18 1911  DDIMER <0.27    Radiology/Studies:  Dg Chest 2 View  Result Date: 01/17/2018 CLINICAL DATA:  Heart palpitations.  Multiple syncopal episodes. EXAM: CHEST - 2 VIEW COMPARISON:  Abdominal serious, 05/31/2014 FINDINGS: Cardiomediastinal silhouette is normal. Mediastinal contours appear intact. There is no evidence of focal airspace consolidation, pleural effusion or pneumothorax. Osseous structures are without acute abnormality. Soft tissues are grossly normal. IMPRESSION: No active cardiopulmonary disease. Electronically Signed   By: Fidela Salisbury M.D.   On: 01/17/2018 20:45    Assessment and Plan:   Holly Bates is a 37 y.o. female with a hx of GERD but no other significant past medical problems but also a family history of HOCM (father s/p  septal myectomy) who is being seen today for the evaluation of recurrent syncope and SVT at the request of Dr. Cruzita Lederer, Internal Medicine.  1. Syncope: No significant findings thus far, other than sinus tach and +  orthostatics. Labs unremarkable, including d-dimer and troponin's and TSH. EEG negative. Brain MRI pending.  Luckily, her 2D echo is normal with no evidence of HCM. EF appears normal. Orthostatic VS positive based on HR with > 15 bpm increase in HR upon standing. ? POTS and inappropriate sinus tachycardia.  We recommend IVF bolus for hydration. Will give 500 ml bolus over 60 min. Will also add a BB, metoprolol tartrate, 25 mg BID. Increase salt intake. We also recommend outpatient cardiac monitor for further monitoring. We will place order and our office will arrange appt time for pickup.    For questions or updates, please contact [place] Please consult [email or web address] for contact info under Cardiology/STEMI.   Signed, Lyda Jester, PA-C  01/18/2018 3:29 PM

## 2018-01-18 NOTE — Progress Notes (Signed)
Order for cardiac outpatient monitor placed. Our office will call with appt time for pickup.

## 2018-01-18 NOTE — Progress Notes (Signed)
EEG completed; results pending.    

## 2018-01-19 ENCOUNTER — Observation Stay (HOSPITAL_COMMUNITY): Payer: Self-pay

## 2018-01-19 LAB — GLUCOSE, CAPILLARY: GLUCOSE-CAPILLARY: 86 mg/dL (ref 70–99)

## 2018-01-19 LAB — T3, FREE: T3, Free: 3.1 pg/mL (ref 2.0–4.4)

## 2018-01-19 MED ORDER — METOPROLOL TARTRATE 25 MG PO TABS: 25.0000 mg | ORAL_TABLET | Freq: Two times a day (BID) | ORAL | 1 refills | Status: DC

## 2018-01-19 NOTE — Discharge Instructions (Signed)
Follow with Cardiology office within a week to set up the monitor  Please get a complete blood count and chemistry panel checked by your Primary MD at your next visit, and again as instructed by your Primary MD. Please get your medications reviewed and adjusted by your Primary MD.  Please request your Primary MD to go over all Hospital Tests and Procedure/Radiological results at the follow up, please get all Hospital records sent to your Prim MD by signing hospital release before you go home.  If you had Pneumonia of Lung problems at the Hospital: Please get a 2 view Chest X ray done in 6-8 weeks after hospital discharge or sooner if instructed by your Primary MD.  If you have Congestive Heart Failure: Please call your Cardiologist or Primary MD anytime you have any of the following symptoms:  1) 3 pound weight gain in 24 hours or 5 pounds in 1 week  2) shortness of breath, with or without a dry hacking cough  3) swelling in the hands, feet or stomach  4) if you have to sleep on extra pillows at night in order to breathe  Follow cardiac low salt diet and 1.5 lit/day fluid restriction.  If you have diabetes Accuchecks 4 times/day, Once in AM empty stomach and then before each meal. Log in all results and show them to your primary doctor at your next visit. If any glucose reading is under 80 or above 300 call your primary MD immediately.  If you have Seizure/Convulsions/Epilepsy: Please do not drive, operate heavy machinery, participate in activities at heights or participate in high speed sports until you have seen by Primary MD or a Neurologist and advised to do so again.  If you had Gastrointestinal Bleeding: Please ask your Primary MD to check a complete blood count within one week of discharge or at your next visit. Your endoscopic/colonoscopic biopsies that are pending at the time of discharge, will also need to followed by your Primary MD.  Get Medicines reviewed and  adjusted. Please take all your medications with you for your next visit with your Primary MD  Please request your Primary MD to go over all hospital tests and procedure/radiological results at the follow up, please ask your Primary MD to get all Hospital records sent to his/her office.  If you experience worsening of your admission symptoms, develop shortness of breath, life threatening emergency, suicidal or homicidal thoughts you must seek medical attention immediately by calling 911 or calling your MD immediately  if symptoms less severe.  You must read complete instructions/literature along with all the possible adverse reactions/side effects for all the Medicines you take and that have been prescribed to you. Take any new Medicines after you have completely understood and accpet all the possible adverse reactions/side effects.   Do not drive or operate heavy machinery when taking Pain medications.   Do not take more than prescribed Pain, Sleep and Anxiety Medications  Special Instructions: If you have smoked or chewed Tobacco  in the last 2 yrs please stop smoking, stop any regular Alcohol  and or any Recreational drug use.  Wear Seat belts while driving.  Please note You were cared for by a hospitalist during your hospital stay. If you have any questions about your discharge medications or the care you received while you were in the hospital after you are discharged, you can call the unit and asked to speak with the hospitalist on call if the hospitalist that took care of you  is not available. Once you are discharged, your primary care physician will handle any further medical issues. Please note that NO REFILLS for any discharge medications will be authorized once you are discharged, as it is imperative that you return to your primary care physician (or establish a relationship with a primary care physician if you do not have one) for your aftercare needs so that they can reassess your need  for medications and monitor your lab values.  You can reach the hospitalist office at phone [phone] or fax [phone]   If you do not have a primary care physician, you can call [phone] for a physician referral.  Activity: As tolerated with Full fall precautions use walker/cane & assistance as needed  Diet: regular  Disposition Home

## 2018-01-19 NOTE — Discharge Summary (Signed)
Physician Discharge Summary  Holly Bates ALP:[id number] DOB: [birth year] DOA: 01/17/2018  PCP: Patient, No Pcp Per  Admit date: 01/17/2018 Discharge date: 01/19/2018  Admitted From: home Disposition:  Home   Recommendations for Outpatient Follow-up:  1. Follow up with PCP in 1-2 weeks 2. Follow-up with cardiology as an outpatient for heart monitor  Home Health: None Equipment/Devices: None  Discharge Condition: Stable CODE STATUS: Full code Diet recommendation: Regular  HPI: Per Dr. Blaine Hamper, Holly Bates is a 37 y.o. female with medical history significant of GERD, dysphagia, who presents with syncope and palpitation.  Patient had syncope 2 days ago, and was evaluated in the ED with negative work-up. Today patient states that she had multiple episodesof loss of consciousness, more than 10 times. Each time lasted for few seconds. No jerky movement. She has woozy feeling. Reports palpitation and heart racing.Per RN report, pt had possible SVT with HR up to 165 in ED. On EKG,seems to be sinus tachycardia, QTC 496 with heart rate 134.  Patient denies any chest pain, shortness of breath, cough, fever or chills.  No nausea, vomiting, diarrhea, abdominal pain, symptoms of UTI.  Denies unilateral weakness, numbness or tingling in extremities.  No vision change or hearing loss.  No facial droop or slurred speech.  Patient states that she had trip to [place] by cruise in March, but no recent long distance traveling since then.  No tenderness in the calf areas. ED Course: pt was found to have negative d-dimer,negative pregnancy test, negative urinalysis, WBC 14.6,electrolytes renal function okay, temperature normal, no tachypnea, oxygen saturation 98% on room air, negative chest x-ray, blood pressure 135/79, tachycardia. Patient is placed on telemetry bed for observation.  Hospital Course: Syncope -admit to the hospital with recurrent syncopal episode.  Given concern for SVT in the  emergency room cardiology was consulted and followed patient while hospitalized.  She did have a few presyncopal episodes while in the hospital and that time she was found to be in sinus tachycardia with rates into the 130s.  This may represent inappropriate sinus tachycardia versus vasomotor syncope due to autonomic dysfunction.  SVTs could not be ruled out and cardiology recommended outpatient monitor.  She was also started on metoprolol, and after initiation of beta-blockers she normally had any episodes.  She underwent a 2D echo which was normal.  Could not get an MRI due to severe claustrophobia and underwent a CT scan of the brain instead which was negative.  EEG was normal.  She was discharged home in stable condition, and will have follow-up with outpatient cardiology for monitor.  Discharge Diagnoses:  Principal Problem:   Syncope Active Problems:   GERD (gastroesophageal reflux disease)   Palpitation   Leukocytosis   Inappropriate sinus tachycardia     Discharge Instructions   Allergies as of 01/19/2018   No Known Allergies     Medication List    TAKE these medications   acetaminophen 325 MG tablet Commonly known as:  TYLENOL Take 650 mg by mouth every 6 (six) hours as needed for moderate pain. Reported on 01/20/2016   metoprolol tartrate 25 MG tablet Commonly known as:  LOPRESSOR Take 1 tablet (25 mg total) by mouth 2 (two) times daily.   multivitamin with minerals tablet Take 1 tablet by mouth daily.   omeprazole 10 MG capsule Commonly known as:  PRILOSEC Take 10 mg by mouth daily. Reported on 01/20/2016   VITAMIN B 12 PO Take 1 tablet by mouth every other day. Reported  on 01/20/2016   vitamin D (CHOLECALCIFEROL) 400 units tablet Take 800 Units by mouth daily. Reported on 01/20/2016      Follow-up Information    Skeet Latch, MD. Schedule an appointment as soon as possible for a visit in 1 week(s).   Specialty:  Cardiology Contact information: [address] [place] [address] [phone]           Consultations:  Cardiology  Procedures/Studies: 2D echo   Study Conclusions - Left ventricle: The cavity size was normal. Wall thickness was normal. Systolic function was normal. The estimated ejection fraction was in the range of 60% to 65%.  EEG  Impression: This awake and drowsy EEG is normal.     Dg Chest 2 View  Result Date: 01/17/2018 CLINICAL DATA:  Heart palpitations.  Multiple syncopal episodes. EXAM: CHEST - 2 VIEW COMPARISON:  Abdominal serious, 05/31/2014 FINDINGS: Cardiomediastinal silhouette is normal. Mediastinal contours appear intact. There is no evidence of focal airspace consolidation, pleural effusion or pneumothorax. Osseous structures are without acute abnormality. Soft tissues are grossly normal. IMPRESSION: No active cardiopulmonary disease. Electronically Signed   By: Fidela Salisbury M.D.   On: 01/17/2018 20:45   Ct Head Wo Contrast  Result Date: 01/19/2018 CLINICAL DATA:  Recurrent syncope EXAM: CT HEAD WITHOUT CONTRAST TECHNIQUE: Contiguous axial images were obtained from the base of the skull through the vertex without intravenous contrast. COMPARISON:  None. FINDINGS: Brain: No evidence of acute infarction, hemorrhage, hydrocephalus, extra-axial collection or mass lesion/mass effect. Vascular: Negative for hyperdense vessel Skull: No skull lesion. Numerous scalp calcified masses bilaterally most consistent with Pilar cysts. Sinuses/Orbits: Negative Other: None IMPRESSION: Negative CT head Electronically Signed   By: Franchot Gallo M.D.   On: 01/19/2018 10:19      Subjective: - no chest pain, shortness of breath, no abdominal pain, nausea or vomiting.   Discharge Exam: Vitals:   01/19/18 0931 01/19/18 1231  BP: 132/87 124/84  Pulse: 71 (!) 59  Resp:  20  Temp:  98.2 F (36.8 C)  SpO2: 100% 100%    General: Pt is alert, awake, not in acute distress Cardiovascular: RRR,  S1/S2 +, no rubs, no gallops Respiratory: CTA bilaterally, no wheezing, no rhonchi Abdominal: Soft, NT, ND, bowel sounds + Extremities: no edema, no cyanosis    The results of significant diagnostics from this hospitalization (including imaging, microbiology, ancillary and laboratory) are listed below for reference.     Microbiology: No results found for this or any previous visit (from the past 240 hour(s)).   Labs: BNP (last 3 results) No results for input(s): BNP in the last 8760 hours. Basic Metabolic Panel: Recent Labs  Lab 01/15/18 1524 01/17/18 1914 01/18/18 0618  NA 141 140 143  K 3.6 3.6 3.4*  CL 107 106 108  CO2 27 27 25   GLUCOSE 104* 135* 97  BUN 10 7 <5*  CREATININE 0.66 0.60 0.58  CALCIUM 9.1 9.1 8.7*   Liver Function Tests: No results for input(s): AST, ALT, ALKPHOS, BILITOT, PROT, ALBUMIN in the last 168 hours. No results for input(s): LIPASE, AMYLASE in the last 168 hours. No results for input(s): AMMONIA in the last 168 hours. CBC: Recent Labs  Lab 01/15/18 1524 01/17/18 1914 01/18/18 0618  WBC 10.4 14.6* 8.4  NEUTROABS 8.5*  --   --   HGB 14.4 13.7 12.3  HCT 43.4 41.4 38.6  MCV 86.1 86.3 87.9  PLT 245 245 214   Cardiac Enzymes: Recent Labs  Lab  01/18/18 0054 01/18/18 0618 01/18/18 1252  TROPONINI <0.03 <0.03 <0.03   BNP: Invalid input(s): POCBNP CBG: Recent Labs  Lab 01/17/18 1911 01/18/18 0738 01/19/18 0734  GLUCAP 121* 97 86   D-Dimer Recent Labs    01/17/18 1911  DDIMER <0.27   Hgb A1c No results for input(s): HGBA1C in the last 72 hours. Lipid Profile No results for input(s): CHOL, HDL, LDLCALC, TRIG, CHOLHDL, LDLDIRECT in the last 72 hours. Thyroid function studies Recent Labs    01/18/18 0054 01/18/18 0618  TSH  --  1.564  T3FREE 3.1  --    Anemia work up No results for input(s): VITAMINB12, FOLATE, FERRITIN, TIBC, IRON, RETICCTPCT in the last 72 hours. Urinalysis    Component Value Date/Time   COLORURINE  YELLOW 01/17/2018 1943   APPEARANCEUR CLEAR 01/17/2018 1943   LABSPEC <1.005 (L) 01/17/2018 1943   PHURINE 7.0 01/17/2018 1943   GLUCOSEU NEGATIVE 01/17/2018 [place] 01/17/2018 [place] 01/17/2018 [place] 01/17/2018 1943   PROTEINUR NEGATIVE 01/17/2018 1943   UROBILINOGEN 0.2 03/24/2015 1327   NITRITE NEGATIVE 01/17/2018 1943   LEUKOCYTESUR NEGATIVE 01/17/2018 1943   Sepsis Labs Invalid input(s): PROCALCITONIN,  WBC,  LACTICIDVEN   Time coordinating discharge: 35 minutes  SIGNED:  Marzetta Board, MD  [facility] 01/19/2018, 2:38 PM Pager [phone]  If 7PM-7AM, please contact night-coverage [email or web address] Password [email or web address]

## 2018-01-19 NOTE — Progress Notes (Signed)
Pt discharged  With family no distress, Iv out and ccmd aware. Transported in wheel chair.

## 2018-01-19 NOTE — Progress Notes (Signed)
Progress Note  Patient Name: Holly Bates Date of Encounter: 01/19/2018  Primary Cardiologist: No primary care provider on file.   Subjective   No palpitations or chest pain.   Inpatient Medications    Scheduled Meds: . cholecalciferol  800 Units Oral Daily  . enoxaparin (LOVENOX) injection  40 mg Subcutaneous Q24H  . metoprolol tartrate  25 mg Oral BID  . multivitamin with minerals  1 tablet Oral Daily  . sodium chloride flush  3 mL Intravenous Q12H  . vitamin B-12  100 mcg Oral QODAY   Continuous Infusions: . sodium chloride 125 mL/hr at 01/19/18 0358   PRN Meds: acetaminophen, ALPRAZolam, metoprolol tartrate, ondansetron (ZOFRAN) IV, pantoprazole, polyethylene glycol, zolpidem   Vital Signs    Vitals:   01/18/18 2129 01/19/18 0017 01/19/18 0332 01/19/18 0931  BP: 136/85 132/80 128/76 132/87  Pulse: 72 80 (!) 55 71  Resp:  18 16   Temp:  98 F (36.7 C) 97.8 F (36.6 C)   TempSrc:  Oral Oral   SpO2:  98% 100% 100%  Weight:   161 lb 1.6 oz (73.1 kg)   Height:        Intake/Output Summary (Last 24 hours) at 01/19/2018 1025 Last data filed at 01/19/2018 1006 Gross per 24 hour  Intake 3785.72 ml  Output 2300 ml  Net 1485.72 ml   Filed Weights   01/17/18 2345 01/18/18 0454 01/19/18 0332  Weight: 158 lb 8 oz (71.9 kg) 159 lb 3.2 oz (72.2 kg) 161 lb 1.6 oz (73.1 kg)    Telemetry    nsr - Personally Reviewed  ECG    nsr - Personally Reviewed  Physical Exam   GEN: No acute distress.   Neck: No JVD Cardiac: RRR, no murmurs, rubs, or gallops.  Respiratory: Clear to auscultation bilaterally. GI: Soft, nontender, non-distended  MS: No edema; No deformity. Neuro:  Nonfocal  Psych: Normal affect   Labs    Chemistry Recent Labs  Lab 01/15/18 1524 01/17/18 1914 01/18/18 0618  NA 141 140 143  K 3.6 3.6 3.4*  CL 107 106 108  CO2 27 27 25   GLUCOSE 104* 135* 97  BUN 10 7 <5*  CREATININE 0.66 0.60 0.58  CALCIUM 9.1 9.1 8.7*  GFRNONAA >60 >60  >60  GFRAA >60 >60 >60  ANIONGAP 7 7 10      Hematology Recent Labs  Lab 01/15/18 1524 01/17/18 1914 01/18/18 0618  WBC 10.4 14.6* 8.4  RBC 5.04 4.80 4.39  HGB 14.4 13.7 12.3  HCT 43.4 41.4 38.6  MCV 86.1 86.3 87.9  MCH 28.6 28.5 28.0  MCHC 33.2 33.1 31.9  RDW 13.2 13.2 12.8  PLT 245 245 214    Cardiac Enzymes Recent Labs  Lab 01/18/18 0054 01/18/18 0618 01/18/18 1252  TROPONINI <0.03 <0.03 <0.03   No results for input(s): TROPIPOC in the last 168 hours.   BNPNo results for input(s): BNP, PROBNP in the last 168 hours.   DDimer  Recent Labs  Lab 01/17/18 1911  DDIMER <0.27     Radiology    Dg Chest 2 View  Result Date: 01/17/2018 CLINICAL DATA:  Heart palpitations.  Multiple syncopal episodes. EXAM: CHEST - 2 VIEW COMPARISON:  Abdominal serious, 05/31/2014 FINDINGS: Cardiomediastinal silhouette is normal. Mediastinal contours appear intact. There is no evidence of focal airspace consolidation, pleural effusion or pneumothorax. Osseous structures are without acute abnormality. Soft tissues are grossly normal. IMPRESSION: No active cardiopulmonary disease. Electronically Signed   By: Fidela Salisbury  M.D.   On: 01/17/2018 20:45   Ct Head Wo Contrast  Result Date: 01/19/2018 CLINICAL DATA:  Recurrent syncope EXAM: CT HEAD WITHOUT CONTRAST TECHNIQUE: Contiguous axial images were obtained from the base of the skull through the vertex without intravenous contrast. COMPARISON:  None. FINDINGS: Brain: No evidence of acute infarction, hemorrhage, hydrocephalus, extra-axial collection or mass lesion/mass effect. Vascular: Negative for hyperdense vessel Skull: No skull lesion. Numerous scalp calcified masses bilaterally most consistent with Pilar cysts. Sinuses/Orbits: Negative Other: None IMPRESSION: Negative CT head Electronically Signed   By: Franchot Gallo M.D.   On: 01/19/2018 10:19    Cardiac Studies   2D echo is normal  Patient Profile     37 y.o. female admitted  with palpitations and syncope.   Assessment & Plan    1. Syncope - the etiology is unclear. Note she has a CT of head pending. She is stable for DC home. I encouraged her to increase her salt and fluid. Most likely etiology of her syncope is vasomotor due to autonomic dysfunction. 2. Palpitations - most likely sinus tachycardia but cannot rule out SVT. Agree with outpatient heart monitor.  [facility] will sign off.   Medication Recommendations:  See above Other recommendations (labs, testing, etc):  Cardiac monitor as an outpatient Follow up as an outpatient:  Dr. Oval Linsey  For questions or updates, please contact [facility] Please consult [email or web address] for contact info under Cardiology/STEMI.      Signed, Cristopher Peru, MD  01/19/2018, 10:26 AM  Patient ID: Holly Bates, female   DOB: 1980-12-05, 37 y.o.   MRN: [id number]

## 2018-01-19 NOTE — Progress Notes (Signed)
Patient alert and oriented with no dizziness or light headedness, CT of head complete and patient up walking in room.

## 2018-01-19 NOTE — Plan of Care (Signed)
  Problem: Education: Goal: Knowledge of General Education information will improve Outcome: Progressing   Problem: Clinical Measurements: Goal: Ability to maintain clinical measurements within normal limits will improve Outcome: Progressing Goal: Respiratory complications will improve Outcome: Progressing   Problem: Activity: Goal: Risk for activity intolerance will decrease Outcome: Progressing   Problem: Elimination: Goal: Will not experience complications related to urinary retention Outcome: Progressing   Problem: Pain Managment: Goal: General experience of comfort will improve Outcome: Progressing

## 2018-01-23 LAB — CULTURE, BLOOD (ROUTINE X 2)
CULTURE: NO GROWTH
Culture: NO GROWTH
SPECIAL REQUESTS: ADEQUATE
SPECIAL REQUESTS: ADEQUATE

## 2018-01-25 DIAGNOSIS — R002 Palpitations: Secondary | ICD-10-CM | POA: Insufficient documentation

## 2018-01-31 ENCOUNTER — Ambulatory Visit (INDEPENDENT_AMBULATORY_CARE_PROVIDER_SITE_OTHER): Payer: Self-pay | Admitting: Cardiovascular Disease

## 2018-01-31 ENCOUNTER — Encounter: Payer: Self-pay | Admitting: Cardiovascular Disease

## 2018-01-31 VITALS — BP 132/72 | HR 80 | Ht 63.0 in | Wt 158.6 lb

## 2018-01-31 DIAGNOSIS — R55 Syncope and collapse: Secondary | ICD-10-CM

## 2018-01-31 DIAGNOSIS — I4711 Inappropriate sinus tachycardia, so stated: Secondary | ICD-10-CM

## 2018-01-31 DIAGNOSIS — R000 Tachycardia, unspecified: Secondary | ICD-10-CM

## 2018-01-31 NOTE — Patient Instructions (Signed)
Medication Instructions:  Your physician recommends that you continue on your current medications as directed. Please refer to the Current Medication list given to you today.  Labwork: NONE  Testing/Procedures: NONE  Follow-Up: Your physician recommends that you schedule a follow-up appointment in: 3-4 MONTHS   If you need a refill on your cardiac medications before your next appointment, please call your pharmacy.

## 2018-01-31 NOTE — Progress Notes (Signed)
Cardiology Office Note   Date:  01/31/2018   ID:  Holly Bates, DOB 15-Oct-1980, MRN [id number]  PCP:  Patient, No Pcp Per  Cardiologist:   Skeet Latch, MD   No chief complaint on file.     History of Present Illness: Holly Bates is a 37 y.o. female with inappropriate sinus tachycardia, GERD and dysphagia who presents for follow up.  She was seen in the hospital 01/2018 for recurrent syncope and palpitations.  The episodes occurred over the two days prior to her presentation.  She had over 10 episodes of loss of consciousness.  In the ED shw was noted to have episodes of sinus tachycardia up to 165 bpm at rest.  Throughout her hospitalization D-dimer was negative, pregnancy test was negative, cardiac enzymes were negative, and echo revealed LVEF 60 to 65%.  She has a family history of HOCM, but none was evident on her echo.  Orthostatic vital signs were taken and there is no drop in blood pressure but she did become tachycardic from the 80s when lying to 1 teens when standing.  She continued to have episodes of sinus tachycardia on telemetry up to the 150s at rest.  She was not anxious and no other causes of her tachycardia were identified.  She was thought to have inappropriate sinus tachycardia and started on metoprolol.  Since discharge she has been feeling well.  She wears a fit bit watch and her heart rate has been in the 50s to 70s.  She has not had any palpitations or recurrent syncope.  She is not exercising because she is afraid of what her heart will do.  She denies any lower extremity edema, orthopnea, or PND.  She has no chest pain or pressure.    Past Medical History:  Diagnosis Date  . Dysphagia   . GERD (gastroesophageal reflux disease)   . Syncope     Past Surgical History:  Procedure Laterality Date  . EYE SURGERY       Current Outpatient Medications  Medication Sig Dispense Refill  . acetaminophen (TYLENOL) 325 MG tablet Take 650 mg by mouth every 6  (six) hours as needed for moderate pain. Reported on 01/20/2016    . Cyanocobalamin (VITAMIN B 12 PO) Take 1 tablet by mouth every other day. Reported on 01/20/2016    . metoprolol tartrate (LOPRESSOR) 25 MG tablet Take 1 tablet (25 mg total) by mouth 2 (two) times daily. 60 tablet 1  . Multiple Vitamins-Minerals (MULTIVITAMIN WITH MINERALS) tablet Take 1 tablet by mouth daily.    . vitamin D, CHOLECALCIFEROL, 400 UNITS tablet Take 800 Units by mouth daily. Reported on 01/20/2016     No current facility-administered medications for this visit.     Allergies:   Patient has no known allergies.    Social History:  The patient  reports that she has never smoked. She has never used smokeless tobacco. She reports that she does not drink alcohol or use drugs.   Family History:  The patient's family history includes Heart disease in her father; Hypertrophic cardiomyopathy (age of onset: 34) in her father.    ROS:  Please see the history of present illness.   Otherwise, review of systems are positive for none.   All other systems are reviewed and negative.    PHYSICAL EXAM: VS:  BP 132/72   Pulse 80   Ht 5\' 3"  (1.6 m)   Wt 158 lb 9.6 oz (71.9 kg)   LMP 01/13/2018  BMI 28.09 kg/m  , BMI Body mass index is 28.09 kg/m. GENERAL:  Well appearing HEENT:  Pupils equal round and reactive, fundi not visualized, oral mucosa unremarkable NECK:  No jugular venous distention, waveform within normal limits, carotid upstroke brisk and symmetric, no bruits LUNGS:  Clear to auscultation bilaterally HEART:  RRR.  PMI not displaced or sustained,S1 and S2 within normal limits, no S3, no S4, no clicks, no rubs, no murmurs ABD:  Flat, positive bowel sounds normal in frequency in pitch, no bruits, no rebound, no guarding, no midline pulsatile mass, no hepatomegaly, no splenomegaly EXT:  2 plus pulses throughout, no edema, no cyanosis no clubbing SKIN:  No rashes no nodules NEURO:  Cranial nerves II through XII  grossly intact, motor grossly intact throughout PSYCH:  Cognitively intact, oriented to person place and time   EKG:  EKG is not ordered today.   Echo 01/18/18: Study Conclusions  - Left ventricle: The cavity size was normal. Wall thickness was   normal. Systolic function was normal. The estimated ejection   fraction was in the range of 60% to 65%.  Recent Labs: 01/18/2018: BUN <5; Creatinine, Ser 0.58; Hemoglobin 12.3; Platelets 214; Potassium 3.4; Sodium 143; TSH 1.564    Lipid Panel No results found for: CHOL, TRIG, HDL, CHOLHDL, VLDL, LDLCALC, LDLDIRECT    Wt Readings from Last 3 Encounters:  01/31/18 158 lb 9.6 oz (71.9 kg)  01/19/18 161 lb 1.6 oz (73.1 kg)  01/15/18 155 lb (70.3 kg)      ASSESSMENT AND PLAN:  # Inappropriate sinus tachycardia:  Symptoms are much better since starting metoprolol.  She was encouraged to start back exercising.  We will cancel her outpatient event monitor.    Current medicines are reviewed at length with the patient today.  The patient does not have concerns regarding medicines.  The following changes have been made:  no change  Labs/ tests ordered today include:  No orders of the defined types were placed in this encounter.    Disposition:   FU with Sadie Hazelett C. Oval Linsey, MD, [facility] in 3-4 months.      Signed, Yasir Kitner C. Oval Linsey, MD, [facility]  01/31/2018 8:51 AM    [facility]

## 2018-03-18 ENCOUNTER — Telehealth: Payer: Self-pay | Admitting: Cardiovascular Disease

## 2018-03-18 MED ORDER — METOPROLOL TARTRATE 25 MG PO TABS: 25.0000 mg | ORAL_TABLET | Freq: Two times a day (BID) | ORAL | 5 refills | Status: DC

## 2018-03-18 NOTE — Telephone Encounter (Signed)
Metoprolol tartrate 25mg  bid refilled for #60 R-5

## 2018-03-18 NOTE — Telephone Encounter (Signed)
New message:       *STAT* If patient is at the pharmacy, call can be transferred to refill team.   1. Which medications need to be refilled? (please list name of each medication and dose if known) metoprolol tartrate (LOPRESSOR) 25 MG tablet  2. Which pharmacy/location (including street and city if local pharmacy) is medication to be sent to?[facility] - [place], [place] - [address]  3. Do they need a 30 day or 90 day supply? [place]

## 2018-05-13 ENCOUNTER — Ambulatory Visit (INDEPENDENT_AMBULATORY_CARE_PROVIDER_SITE_OTHER): Payer: Self-pay | Admitting: Cardiovascular Disease

## 2018-05-13 ENCOUNTER — Encounter: Payer: Self-pay | Admitting: Cardiovascular Disease

## 2018-05-13 VITALS — BP 120/80 | HR 72 | Ht 63.0 in | Wt 169.6 lb

## 2018-05-13 DIAGNOSIS — R000 Tachycardia, unspecified: Secondary | ICD-10-CM

## 2018-05-13 MED ORDER — DILTIAZEM HCL 30 MG PO TABS: 30.0000 mg | ORAL_TABLET | Freq: Two times a day (BID) | ORAL | 1 refills | Status: DC

## 2018-05-13 NOTE — Progress Notes (Signed)
Cardiology Office Note   Date:  05/13/2018   ID:  Holly Bates, DOB June 17, 1981, MRN [id number]  PCP:  Patient, No Pcp Per  Cardiologist:   Skeet Latch, MD   No chief complaint on file.     History of Present Illness: Holly Bates is a 37 y.o. female with inappropriate sinus tachycardia, GERD and dysphagia who presents for follow up.  She was seen in the hospital 01/2018 for recurrent syncope and palpitations.  The episodes occurred over the two days prior to her presentation.  She had over 10 episodes of loss of consciousness.  In the ED she was noted to have episodes of sinus tachycardia up to 165 bpm at rest.  Throughout her hospitalization D-dimer was negative, pregnancy test was negative, cardiac enzymes were negative, and echo revealed LVEF 60 to 65%.  She has a family history of HOCM, but none was evident on her echo.  Orthostatic vital signs were taken and there is no drop in blood pressure but she did become tachycardic from the 80s when lying to 110s when standing.  She continued to have episodes of sinus tachycardia on telemetry up to the 150s at rest.  She was not anxious and no other causes of her tachycardia were identified.  She was thought to have inappropriate sinus tachycardia and started on metoprolol.    At her last appointment Ms. Mckibbin was doing well.  Today her palpitations and syncope continue to be well-controlled.  However, she notices that she is not sweating as much when she exercises.  This causes her to get very hot and she is therefore avoiding exercise.  She also reports photosensitivity.  She has some mild ankle swelling that is persistent throughout the day and does not change with elevation of her legs.  She has no chest pain or shortness of breath.  She had only one episode of palpitations that she attributes to taking her medication on an empty stomach.  She attributes all of these side effects to her metoprolol.     Past Medical History:    Diagnosis Date  . Dysphagia   . GERD (gastroesophageal reflux disease)   . Syncope     Past Surgical History:  Procedure Laterality Date  . EYE SURGERY       Current Outpatient Medications  Medication Sig Dispense Refill  . acetaminophen (TYLENOL) 325 MG tablet Take 650 mg by mouth every 6 (six) hours as needed for moderate pain. Reported on 01/20/2016    . Cyanocobalamin (VITAMIN B 12 PO) Take 1 tablet by mouth every other day. Reported on 01/20/2016    . Multiple Vitamins-Minerals (MULTIVITAMIN WITH MINERALS) tablet Take 1 tablet by mouth daily.    . vitamin D, CHOLECALCIFEROL, 400 UNITS tablet Take 800 Units by mouth daily. Reported on 01/20/2016    . diltiazem (CARDIZEM) 30 MG tablet Take 1 tablet (30 mg total) by mouth 2 (two) times daily. 180 tablet 1   No current facility-administered medications for this visit.     Allergies:   Patient has no known allergies.    Social History:  The patient  reports that she has never smoked. She has never used smokeless tobacco. She reports that she does not drink alcohol or use drugs.   Family History:  The patient's family history includes Heart disease in her father; Hypertrophic cardiomyopathy (age of onset: 96) in her father.    ROS:  Please see the history of present illness.   Otherwise, review of  systems are positive for none.   All other systems are reviewed and negative.    PHYSICAL EXAM: VS:  BP 120/80   Pulse 72   Ht 5\' 3"  (1.6 m)   Wt 169 lb 9.6 oz (76.9 kg)   BMI 30.04 kg/m  , BMI Body mass index is 30.04 kg/m. GENERAL:  Well appearing HEENT: Pupils equal round and reactive, fundi not visualized, oral mucosa unremarkable NECK:  No jugular venous distention, waveform within normal limits, carotid upstroke brisk and symmetric, no bruits, no thyromegaly LUNGS:  Clear to auscultation bilaterally HEART:  RRR.  PMI not displaced or sustained,S1 and S2 within normal limits, no S3, no S4, no clicks, no rubs, no   murmurs ABD:  Flat, positive bowel sounds normal in frequency in pitch, no bruits, no rebound, no guarding, no midline pulsatile mass, no hepatomegaly, no splenomegaly EXT:  2 plus pulses throughout, no edema, no cyanosis no clubbing SKIN:  No rashes no nodules NEURO:  Cranial nerves II through XII grossly intact, motor grossly intact throughout PSYCH:  Cognitively intact, oriented to person place and time   EKG:  EKG is not ordered today.   Echo 01/18/18: Study Conclusions  - Left ventricle: The cavity size was normal. Wall thickness was   normal. Systolic function was normal. The estimated ejection   fraction was in the range of 60% to 65%.  Normal diastolic function.   Recent Labs: 01/18/2018: BUN <5; Creatinine, Ser 0.58; Hemoglobin 12.3; Platelets 214; Potassium 3.4; Sodium 143; TSH 1.564    Lipid Panel No results found for: CHOL, TRIG, HDL, CHOLHDL, VLDL, LDLCALC, LDLDIRECT    Wt Readings from Last 3 Encounters:  05/13/18 169 lb 9.6 oz (76.9 kg)  01/31/18 158 lb 9.6 oz (71.9 kg)  01/19/18 161 lb 1.6 oz (73.1 kg)      ASSESSMENT AND PLAN:  # Inappropriate sinus tachycardia:  Symptoms are much better since starting metoprolol.  However, she reports having multiple side effects and would like to try something different.  We will stop metoprolol and use diltiazem 30 mill grams twice daily.  This dose can be increased if her tachycardia is not well-controlled.   Current medicines are reviewed at length with the patient today.  The patient does not have concerns regarding medicines.  The following changes have been made: Switch metoprolol to diltiazem  Labs/ tests ordered today include:  No orders of the defined types were placed in this encounter.    Disposition:   FU with Dorethy Tomey C. Oval Linsey, MD, [facility] in 2 months.     Signed, Markanthony Gedney C. Oval Linsey, MD, [facility]  05/13/2018 10:44 AM    [facility]

## 2018-05-13 NOTE — Patient Instructions (Signed)
Medication Instructions:  STOP METOPROLOL   START DILTIAZEM 30 MG TWICE A DAY   If you need a refill on your cardiac medications before your next appointment, please call your pharmacy.   Lab work: NONE  Testing/Procedures: NONE  Follow-Up: At [facility], you and your health needs are our priority.  As part of our continuing mission to provide you with exceptional heart care, we have created designated Provider Care Teams.  These Care Teams include your primary Cardiologist (physician) and Advanced Practice Providers (APPs -  Physician Assistants and Nurse Practitioners) who all work together to provide you with the care you need, when you need it. You will need a follow up appointment in 2 months. You may see DR [facility]  or one of the following Advanced Practice Providers on your designated Care Team:   Kerin Ransom, PA-C Roby Lofts, [place] . Sande Rives, PA-C

## 2018-05-30 ENCOUNTER — Ambulatory Visit (INDEPENDENT_AMBULATORY_CARE_PROVIDER_SITE_OTHER): Payer: Self-pay | Admitting: Family Medicine

## 2018-05-30 ENCOUNTER — Encounter: Payer: Self-pay | Admitting: Family Medicine

## 2018-05-30 VITALS — BP 128/80 | HR 100 | Temp 97.7°F | Wt 169.2 lb

## 2018-05-30 DIAGNOSIS — L744 Anhidrosis: Secondary | ICD-10-CM

## 2018-05-30 NOTE — Progress Notes (Signed)
   Subjective:    Patient ID: Holly Bates, female    DOB: [birth year], 37 y.o.   MRN: [id number]  HPI She is here for consult concerning lack of sweating.  She states that this has been the case since July and days until around the time that she was admitted for treatment of SVT.  She states that her skin is dry and waxy.  She says her tears are thicker than normal.  She is also noted some slight dryness in her mouth.  She has not noted any eye related symptoms nor is she had joint aches or pains.  No chest pain, shortness of breath.   Review of Systems     Objective:   Physical Exam Alert and in no distress.  DTRs are normal.  Sclera are clear.  Skin is normal in texture.  Tympanic membranes and canals are normal. Pharyngeal area is normal. Neck is supple without adenopathy or thyromegaly. Cardiac exam shows a regular sinus rhythm without murmurs or gallops. Lungs are clear to auscultation.  Abdominal exam shows no masses or tenderness.        Assessment & Plan:  Sweating absence - Plan: CBC with Differential/Platelet, Comprehensive metabolic panel, Lupus Anticoagulant Panel This could be the early onset of a collagen vascular disease like Sjogren's or possibly even lupus.  I will do some basic screening and probably refer to rheumatology.

## 2018-05-31 LAB — COMPREHENSIVE METABOLIC PANEL
ALBUMIN: 4.6 g/dL (ref 3.5–5.5)
ALT: 14 IU/L (ref 0–32)
AST: 19 IU/L (ref 0–40)
Albumin/Globulin Ratio: 1.6 (ref 1.2–2.2)
Alkaline Phosphatase: 63 IU/L (ref 39–117)
BUN / CREAT RATIO: 18 (ref 9–23)
BUN: 10 mg/dL (ref 6–20)
Bilirubin Total: 0.4 mg/dL (ref 0.0–1.2)
CO2: 24 mmol/L (ref 20–29)
CREATININE: 0.57 mg/dL (ref 0.57–1.00)
Calcium: 9.7 mg/dL (ref 8.7–10.2)
Chloride: 101 mmol/L (ref 96–106)
GFR, EST AFRICAN AMERICAN: 139 mL/min/{1.73_m2} (ref >59)
GFR, EST NON AFRICAN AMERICAN: 120 mL/min/{1.73_m2} (ref >59)
GLUCOSE: 102 mg/dL — AB (ref 65–99)
Globulin, Total: 2.8 g/dL (ref 1.5–4.5)
Potassium: 4 mmol/L (ref 3.5–5.2)
Sodium: 139 mmol/L (ref 134–144)
TOTAL PROTEIN: 7.4 g/dL (ref 6.0–8.5)

## 2018-05-31 LAB — CBC WITH DIFFERENTIAL/PLATELET
BASOS ABS: 0.1 10*3/uL (ref 0.0–0.2)
Basos: 1 %
EOS (ABSOLUTE): 0.2 10*3/uL (ref 0.0–0.4)
Eos: 2 %
HEMOGLOBIN: 14.2 g/dL (ref 11.1–15.9)
Hematocrit: 41.7 % (ref 34.0–46.6)
IMMATURE GRANS (ABS): 0 10*3/uL (ref 0.0–0.1)
IMMATURE GRANULOCYTES: 0 %
Lymphocytes Absolute: 2.7 10*3/uL (ref 0.7–3.1)
Lymphs: 33 %
MCH: 28.6 pg (ref 26.6–33.0)
MCHC: 34.1 g/dL (ref 31.5–35.7)
MCV: 84 fL (ref 79–97)
MONOCYTES: 4 %
Monocytes Absolute: 0.3 10*3/uL (ref 0.1–0.9)
NEUTROS ABS: 4.9 10*3/uL (ref 1.4–7.0)
NEUTROS PCT: 60 %
PLATELETS: 267 10*3/uL (ref 150–450)
RBC: 4.97 x10E6/uL (ref 3.77–5.28)
RDW: 12.1 % — ABNORMAL LOW (ref 12.3–15.4)
WBC: 8.2 10*3/uL (ref 3.4–10.8)

## 2018-05-31 LAB — SLE PROFILE C
ENA RNP Ab: <0.2 AI (ref 0.0–0.9)
ENA SM Ab Ser-aCnc: <0.2 AI (ref 0.0–0.9)
dsDNA Ab: <1 IU/mL (ref 0–9)

## 2018-06-04 ENCOUNTER — Other Ambulatory Visit: Payer: Self-pay

## 2018-06-04 DIAGNOSIS — L744 Anhidrosis: Secondary | ICD-10-CM

## 2018-07-18 ENCOUNTER — Ambulatory Visit (INDEPENDENT_AMBULATORY_CARE_PROVIDER_SITE_OTHER): Payer: Self-pay | Admitting: Cardiovascular Disease

## 2018-07-18 ENCOUNTER — Encounter: Payer: Self-pay | Admitting: Cardiovascular Disease

## 2018-07-18 VITALS — BP 120/76 | HR 88 | Ht 63.0 in | Wt 168.4 lb

## 2018-07-18 DIAGNOSIS — R000 Tachycardia, unspecified: Secondary | ICD-10-CM

## 2018-07-18 MED ORDER — DILTIAZEM HCL ER COATED BEADS 120 MG PO CP24: 120.0000 mg | ORAL_CAPSULE | Freq: Every day | ORAL | 3 refills | Status: DC

## 2018-07-18 NOTE — Progress Notes (Signed)
Cardiology Office Note   Date:  07/18/2018   ID:  Holly Bates, DOB [birth year], MRN [id number]  PCP:  Denita Lung, MD  Cardiologist:   Skeet Latch, MD   No chief complaint on file.    History of Present Illness: Holly Bates is a 38 y.o. female with inappropriate sinus tachycardia, GERD and dysphagia who presents for follow up.  She was seen in the hospital 01/2018 for recurrent syncope and palpitations.  The episodes occurred over the two days prior to her presentation.  She had over 10 episodes of loss of consciousness.  In the ED she was noted to have episodes of sinus tachycardia up to 165 bpm at rest.  Throughout her hospitalization D-dimer was negative, pregnancy test was negative, cardiac enzymes were negative, and echo revealed LVEF 60 to 65%.  She has a family history of HOCM, but none was evident on her echo.  Orthostatic vital signs were taken and there is no drop in blood pressure but she did become tachycardic from the 80s when lying to 110s when standing.  She continued to have episodes of sinus tachycardia on telemetry up to the 150s at rest.  She was not anxious and no other causes of her tachycardia were identified.  She was thought to have inappropriate sinus tachycardia and started on metoprolol.  Her symptoms improved with metoprolol.  However she noted some side effects and wanted to try something different.  At her last appointment she was switched to diltiazem.  Since her last appointment Holly Bates has been feeling about the same.  She has not experienced any palpitations or syncope.  She notes that her blood pressure has been a little bit higher.  It is ranged anywhere from the 110s to the 140s.  She has been feeling a little stressed lately which she associates with the holidays.  She is not exercising because she continues to have difficulty with not sweating.  She has not made any changes in her diet.  She denies any chest pain, shortness of breath, lower  extremity edema, orthopnea, or PND.   Past Medical History:  Diagnosis Date  . Dysphagia   . GERD (gastroesophageal reflux disease)   . Syncope     Past Surgical History:  Procedure Laterality Date  . EYE SURGERY       Current Outpatient Medications  Medication Sig Dispense Refill  . acetaminophen (TYLENOL) 325 MG tablet Take 650 mg by mouth every 6 (six) hours as needed for moderate pain. Reported on 01/20/2016    . Cyanocobalamin (VITAMIN B 12 PO) Take 1 tablet by mouth every other day. Reported on 01/20/2016    . Multiple Vitamins-Minerals (MULTIVITAMIN WITH MINERALS) tablet Take 1 tablet by mouth daily.    . vitamin D, CHOLECALCIFEROL, 400 UNITS tablet Take 800 Units by mouth daily. Reported on 01/20/2016    . diltiazem (CARDIZEM CD) 120 MG 24 hr capsule Take 1 capsule (120 mg total) by mouth daily. 90 capsule 3   No current facility-administered medications for this visit.     Allergies:   Patient has no known allergies.    Social History:  The patient  reports that she has never smoked. She has never used smokeless tobacco. She reports that she does not drink alcohol or use drugs.   Family History:  The patient's family history includes Heart disease in her father; Hypertrophic cardiomyopathy (age of onset: 77) in her father.    ROS:  Please see the history  of present illness.   Otherwise, review of systems are positive for none.   All other systems are reviewed and negative.    PHYSICAL EXAM: VS:  BP 120/76   Pulse 88   Ht 5\' 3"  (1.6 m)   Wt 168 lb 6.4 oz (76.4 kg)   BMI 29.83 kg/m  , BMI Body mass index is 29.83 kg/m. GENERAL:  Well appearing HEENT: Pupils equal round and reactive, fundi not visualized, oral mucosa unremarkable NECK:  No jugular venous distention, waveform within normal limits, carotid upstroke brisk and symmetric, no bruits LUNGS:  Clear to auscultation bilaterally HEART:  RRR.  PMI not displaced or sustained,S1 and S2 within normal limits, no  S3, no S4, no clicks, no rubs, no murmurs ABD:  Flat, positive bowel sounds normal in frequency in pitch, no bruits, no rebound, no guarding, no midline pulsatile mass, no hepatomegaly, no splenomegaly EXT:  2 plus pulses throughout, no edema, no cyanosis no clubbing SKIN:  No rashes no nodules NEURO:  Cranial nerves II through XII grossly intact, motor grossly intact throughout PSYCH:  Cognitively intact, oriented to person place and time   EKG:  EKG is ordered today. 07/18/2018: Sinus rhythm.  Sinus arrhythmia.  Rate 88 bpm.  Echo 01/18/18: Study Conclusions  - Left ventricle: The cavity size was normal. Wall thickness was   normal. Systolic function was normal. The estimated ejection   fraction was in the range of 60% to 65%.  Normal diastolic function.   Recent Labs: 01/18/2018: TSH 1.564 05/30/2018: ALT 14; BUN 10; Creatinine, Ser 0.57; Hemoglobin 14.2; Platelets 267; Potassium 4.0; Sodium 139    Lipid Panel No results found for: CHOL, TRIG, HDL, CHOLHDL, VLDL, LDLCALC, LDLDIRECT    Wt Readings from Last 3 Encounters:  07/18/18 168 lb 6.4 oz (76.4 kg)  05/30/18 169 lb 3.2 oz (76.7 kg)  05/13/18 169 lb 9.6 oz (76.9 kg)      ASSESSMENT AND PLAN:  # Inappropriate sinus tachycardia:  Symptoms are controlled on diltiazem.  She did not tolerate metoprolol very well.  Her blood pressure has been up and down.  It was initially elevated but better on repeat.  We will switch diltiazem from 30 mg twice daily to 120 mg daily.  She will continue to monitor her blood pressure at home.   Current medicines are reviewed at length with the patient today.  The patient does not have concerns regarding medicines.  The following changes have been made: Switch metoprolol to diltiazem  Labs/ tests ordered today include:   Orders Placed This Encounter  Procedures  . EKG 12-Lead     Disposition:   FU with Holly Fiallos C. Oval Linsey, MD, [facility] in 2 months.     Signed, Aarion Kittrell C. Oval Linsey, MD,  [facility]  07/18/2018 9:52 AM    [place]

## 2018-07-18 NOTE — Patient Instructions (Signed)
Medication Instructions:  STOP DILTIAZEM 30  START DILTIAZEM 120 MG ONCE DAILY   If you need a refill on your cardiac medications before your next appointment, please call your pharmacy.   Lab work: NONE  Testing/Procedures: NONE  Follow-Up: At [facility], you and your health needs are our priority.  As part of our continuing mission to provide you with exceptional heart care, we have created designated Provider Care Teams.  These Care Teams include your primary Cardiologist (physician) and Advanced Practice Providers (APPs -  Physician Assistants and Nurse Practitioners) who all work together to provide you with the care you need, when you need it. You will need a follow up appointment in 2 months.   You may see DR [facility]  or one of the following Advanced Practice Providers on your designated Care Team:   Kerin Ransom, PA-C Roby Lofts, [place] . Sande Rives, PA-C

## 2018-09-19 ENCOUNTER — Encounter: Payer: Self-pay | Admitting: Cardiovascular Disease

## 2018-09-19 ENCOUNTER — Ambulatory Visit (INDEPENDENT_AMBULATORY_CARE_PROVIDER_SITE_OTHER): Payer: Self-pay | Admitting: Cardiovascular Disease

## 2018-09-19 ENCOUNTER — Other Ambulatory Visit: Payer: Self-pay

## 2018-09-19 VITALS — BP 128/66 | HR 76 | Ht 63.0 in | Wt 171.0 lb

## 2018-09-19 DIAGNOSIS — R000 Tachycardia, unspecified: Secondary | ICD-10-CM

## 2018-09-19 DIAGNOSIS — R55 Syncope and collapse: Secondary | ICD-10-CM

## 2018-09-19 NOTE — Progress Notes (Signed)
Cardiology Office Note   Date:  09/19/2018   ID:  Holly Bates, DOB 12-27-1980, MRN [id number]  PCP:  Denita Lung, MD  Cardiologist:   Skeet Latch, MD   No chief complaint on file.    History of Present Illness: Holly Bates is a 38 y.o. female with inappropriate sinus tachycardia, GERD and dysphagia who presents for follow up.  She was seen in the hospital 01/2018 for recurrent syncope and palpitations.  The episodes occurred over the two days prior to her presentation.  She had over 10 episodes of loss of consciousness.  In the ED she was noted to have episodes of sinus tachycardia up to 165 bpm at rest.  Throughout her hospitalization D-dimer was negative, pregnancy test was negative, cardiac enzymes were negative, and echo revealed LVEF 60 to 65%.  She has a family history of HOCM, but none was evident on her echo.  Orthostatic vital signs were taken and there is no drop in blood pressure but she did become tachycardic from the 80s when lying to 110s when standing.  She continued to have episodes of sinus tachycardia on telemetry up to the 150s at rest.  She was not anxious and no other causes of her tachycardia were identified.  She was thought to have inappropriate sinus tachycardia and started on metoprolol.  Her symptoms improved with metoprolol.  However she noted some side effects and wanted to try something different.   At her last appointment diltiazem was switched to 120 mg daily.  Since then her heart rate has been mostly in the 70s at home.  She has not had any palpitations lately.  She denies lightheadedness or dizziness.  She is trying to exercise more by walking.  She has no exertional symptoms, though she does sometimes feel hot when she walks.  She denies lower extremity edema, orthopnea, or PND.   Past Medical History:  Diagnosis Date  . Dysphagia   . GERD (gastroesophageal reflux disease)   . Syncope     Past Surgical History:  Procedure Laterality  Date  . EYE SURGERY       Current Outpatient Medications  Medication Sig Dispense Refill  . acetaminophen (TYLENOL) 325 MG tablet Take 650 mg by mouth every 6 (six) hours as needed for moderate pain. Reported on 01/20/2016    . Cyanocobalamin (VITAMIN B 12 PO) Take 1 tablet by mouth every other day. Reported on 01/20/2016    . diltiazem (CARDIZEM CD) 120 MG 24 hr capsule Take 1 capsule (120 mg total) by mouth daily. 90 capsule 3  . Multiple Vitamins-Minerals (MULTIVITAMIN WITH MINERALS) tablet Take 1 tablet by mouth daily.    . vitamin D, CHOLECALCIFEROL, 400 UNITS tablet Take 800 Units by mouth daily. Reported on 01/20/2016     No current facility-administered medications for this visit.     Allergies:   Patient has no known allergies.    Social History:  The patient  reports that she has never smoked. She has never used smokeless tobacco. She reports that she does not drink alcohol or use drugs.   Family History:  The patient's family history includes Heart disease in her father; Hypertrophic cardiomyopathy (age of onset: 14) in her father.    ROS:  Please see the history of present illness.   Otherwise, review of systems are positive for none.   All other systems are reviewed and negative.    PHYSICAL EXAM: VS:  BP 128/66   Pulse 76  Ht 5\' 3"  (1.6 m)   Wt 171 lb (77.6 kg)   LMP 09/18/2018   BMI 30.29 kg/m  , BMI Body mass index is 30.29 kg/m. GENERAL:  Well appearing HEENT: Pupils equal round and reactive, fundi not visualized, oral mucosa unremarkable NECK:  No jugular venous distention, waveform within normal limits, carotid upstroke brisk and symmetric, no bruits LUNGS:  Clear to auscultation bilaterally HEART:  RRR.  PMI not displaced or sustained,S1 and S2 within normal limits, no S3, no S4, no clicks, no rubs, no murmurs ABD:  Flat, positive bowel sounds normal in frequency in pitch, no bruits, no rebound, no guarding, no midline pulsatile mass, no hepatomegaly, no  splenomegaly EXT:  2 plus pulses throughout, no edema, no cyanosis no clubbing SKIN:  No rashes no nodules NEURO:  Cranial nerves II through XII grossly intact, motor grossly intact throughout PSYCH:  Cognitively intact, oriented to person place and time   EKG:  EKG is ordered today. 07/18/2018: Sinus rhythm.  Sinus arrhythmia.  Rate 88 bpm.  Echo 01/18/18: Study Conclusions  - Left ventricle: The cavity size was normal. Wall thickness was   normal. Systolic function was normal. The estimated ejection   fraction was in the range of 60% to 65%.  Normal diastolic function.   Recent Labs: 01/18/2018: TSH 1.564 05/30/2018: ALT 14; BUN 10; Creatinine, Ser 0.57; Hemoglobin 14.2; Platelets 267; Potassium 4.0; Sodium 139    Lipid Panel No results found for: CHOL, TRIG, HDL, CHOLHDL, VLDL, LDLCALC, LDLDIRECT    Wt Readings from Last 3 Encounters:  09/19/18 171 lb (77.6 kg)  07/18/18 168 lb 6.4 oz (76.4 kg)  05/30/18 169 lb 3.2 oz (76.7 kg)      ASSESSMENT AND PLAN:  # Inappropriate sinus tachycardia:  Symptoms are controlled on diltiazem.  No changes.    Current medicines are reviewed at length with the patient today.  The patient does not have concerns regarding medicines.  The following changes have been made: None  Labs/ tests ordered today include:   No orders of the defined types were placed in this encounter.    Disposition:   FU with Amayrany Cafaro C. Oval Linsey, MD, [facility] in 1 year.    Signed, Barclay Lennox C. Oval Linsey, MD, [facility]  09/19/2018 11:40 AM    [place]

## 2018-09-19 NOTE — Patient Instructions (Signed)
Medication Instructions:  Your physician recommends that you continue on your current medications as directed. Please refer to the Current Medication list given to you today.  If you need a refill on your cardiac medications before your next appointment, please call your pharmacy.   Lab work: NONE  Testing/Procedures: NONE   Follow-Up: At [facility], you and your health needs are our priority.  As part of our continuing mission to provide you with exceptional heart care, we have created designated Provider Care Teams.  These Care Teams include your primary Cardiologist (physician) and Advanced Practice Providers (APPs -  Physician Assistants and Nurse Practitioners) who all work together to provide you with the care you need, when you need it. You will need a follow up appointment in 12 months.  Please call our office 2 months in advance to schedule this appointment.  You may see  or one of the following Advanced Practice Providers on your designated Care Team:   Kerin Ransom, PA-C Roby Lofts, [place] . Sande Rives, PA-C

## 2019-05-26 ENCOUNTER — Other Ambulatory Visit: Payer: Self-pay | Admitting: Cardiovascular Disease

## 2019-08-14 ENCOUNTER — Other Ambulatory Visit: Payer: Self-pay | Admitting: Cardiovascular Disease

## 2019-09-02 ENCOUNTER — Telehealth: Payer: Self-pay | Admitting: *Deleted

## 2019-09-02 NOTE — Telephone Encounter (Signed)
A message was left, re: her follow up visit. 

## 2020-01-23 IMAGING — DX DG CHEST 2V
2 series · 2 of 2 positions shown · non-contrast
Comparison: Abdominal serious, 05/31/2014

CLINICAL DATA: Heart palpitations.  Multiple syncopal episodes.

EXAM:
CHEST - 2 VIEW

[chest pa]
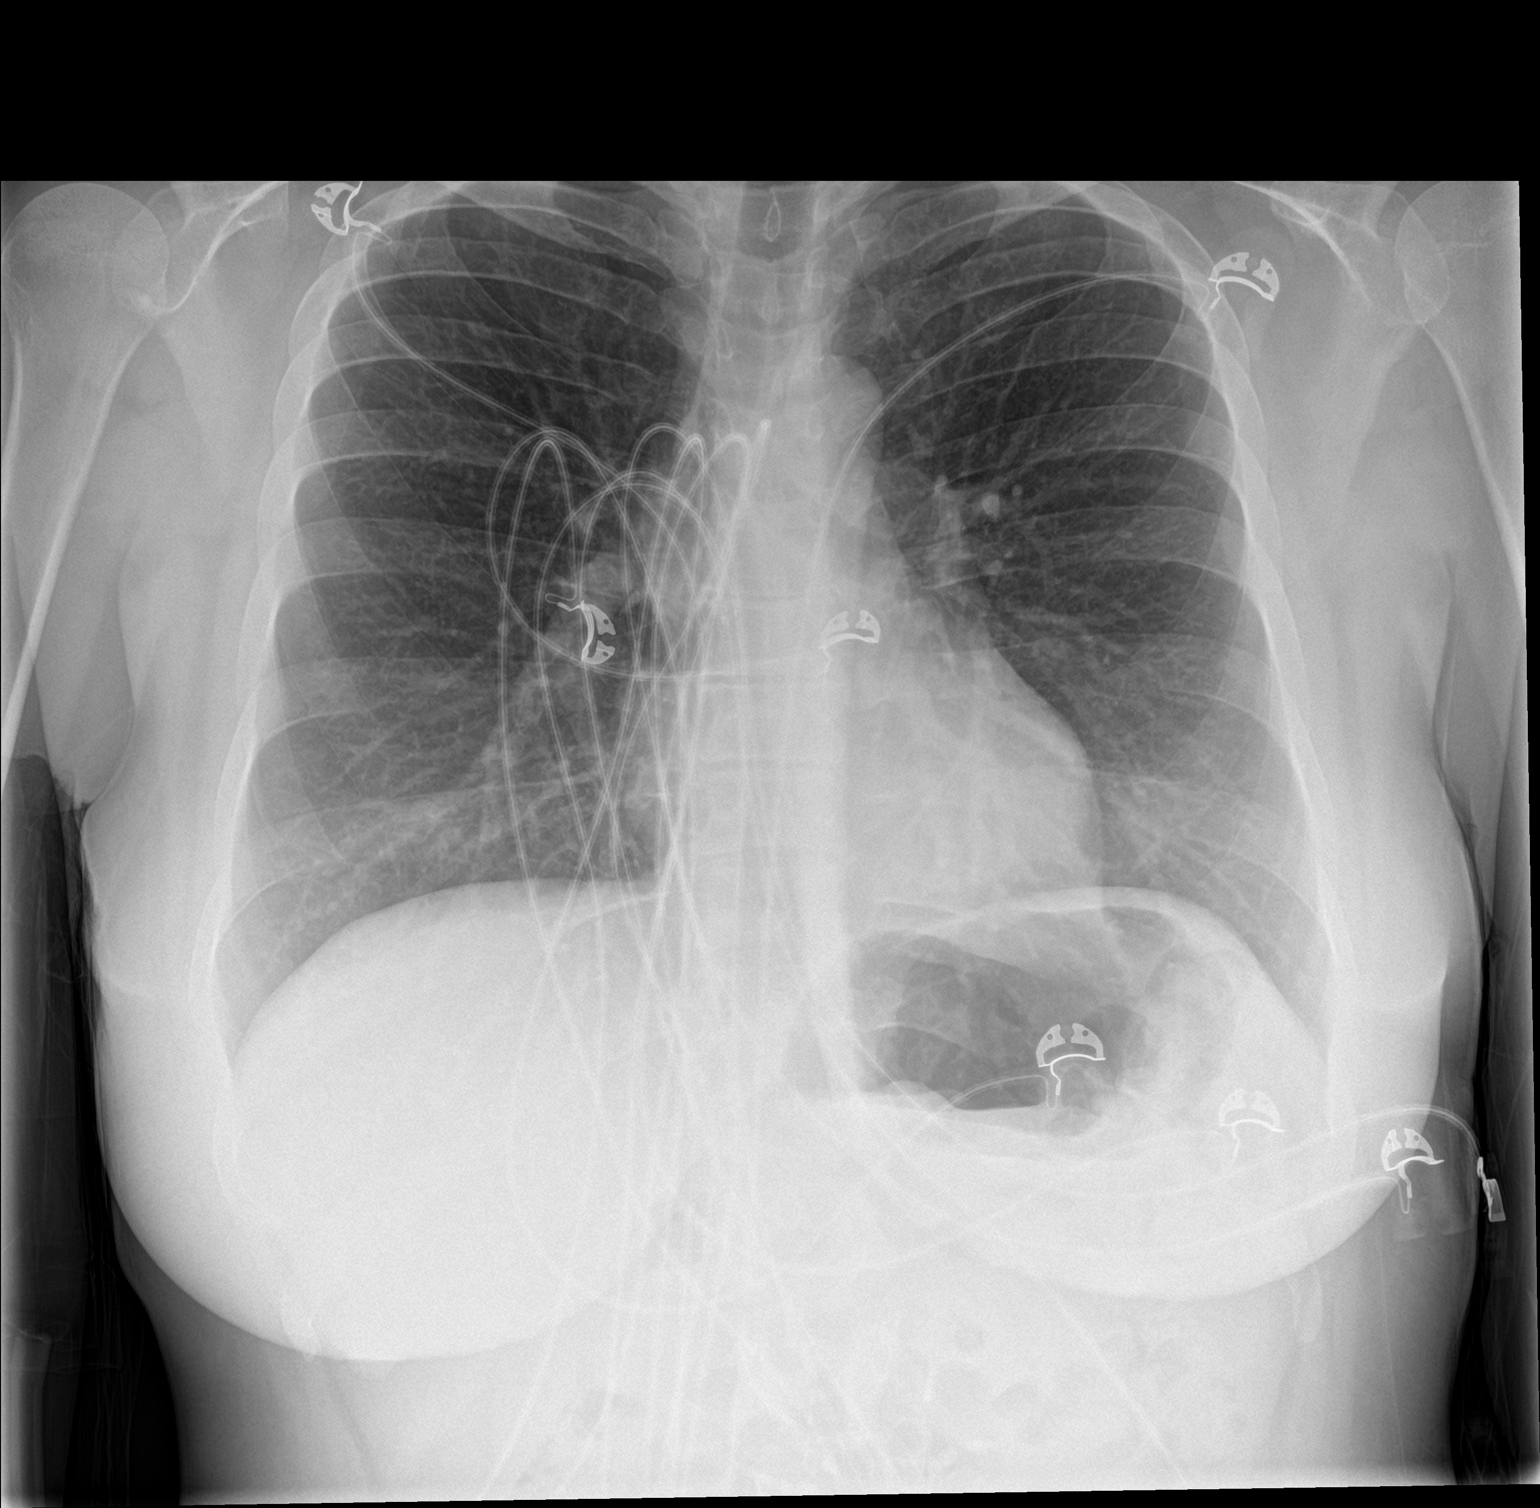

[chest lat]
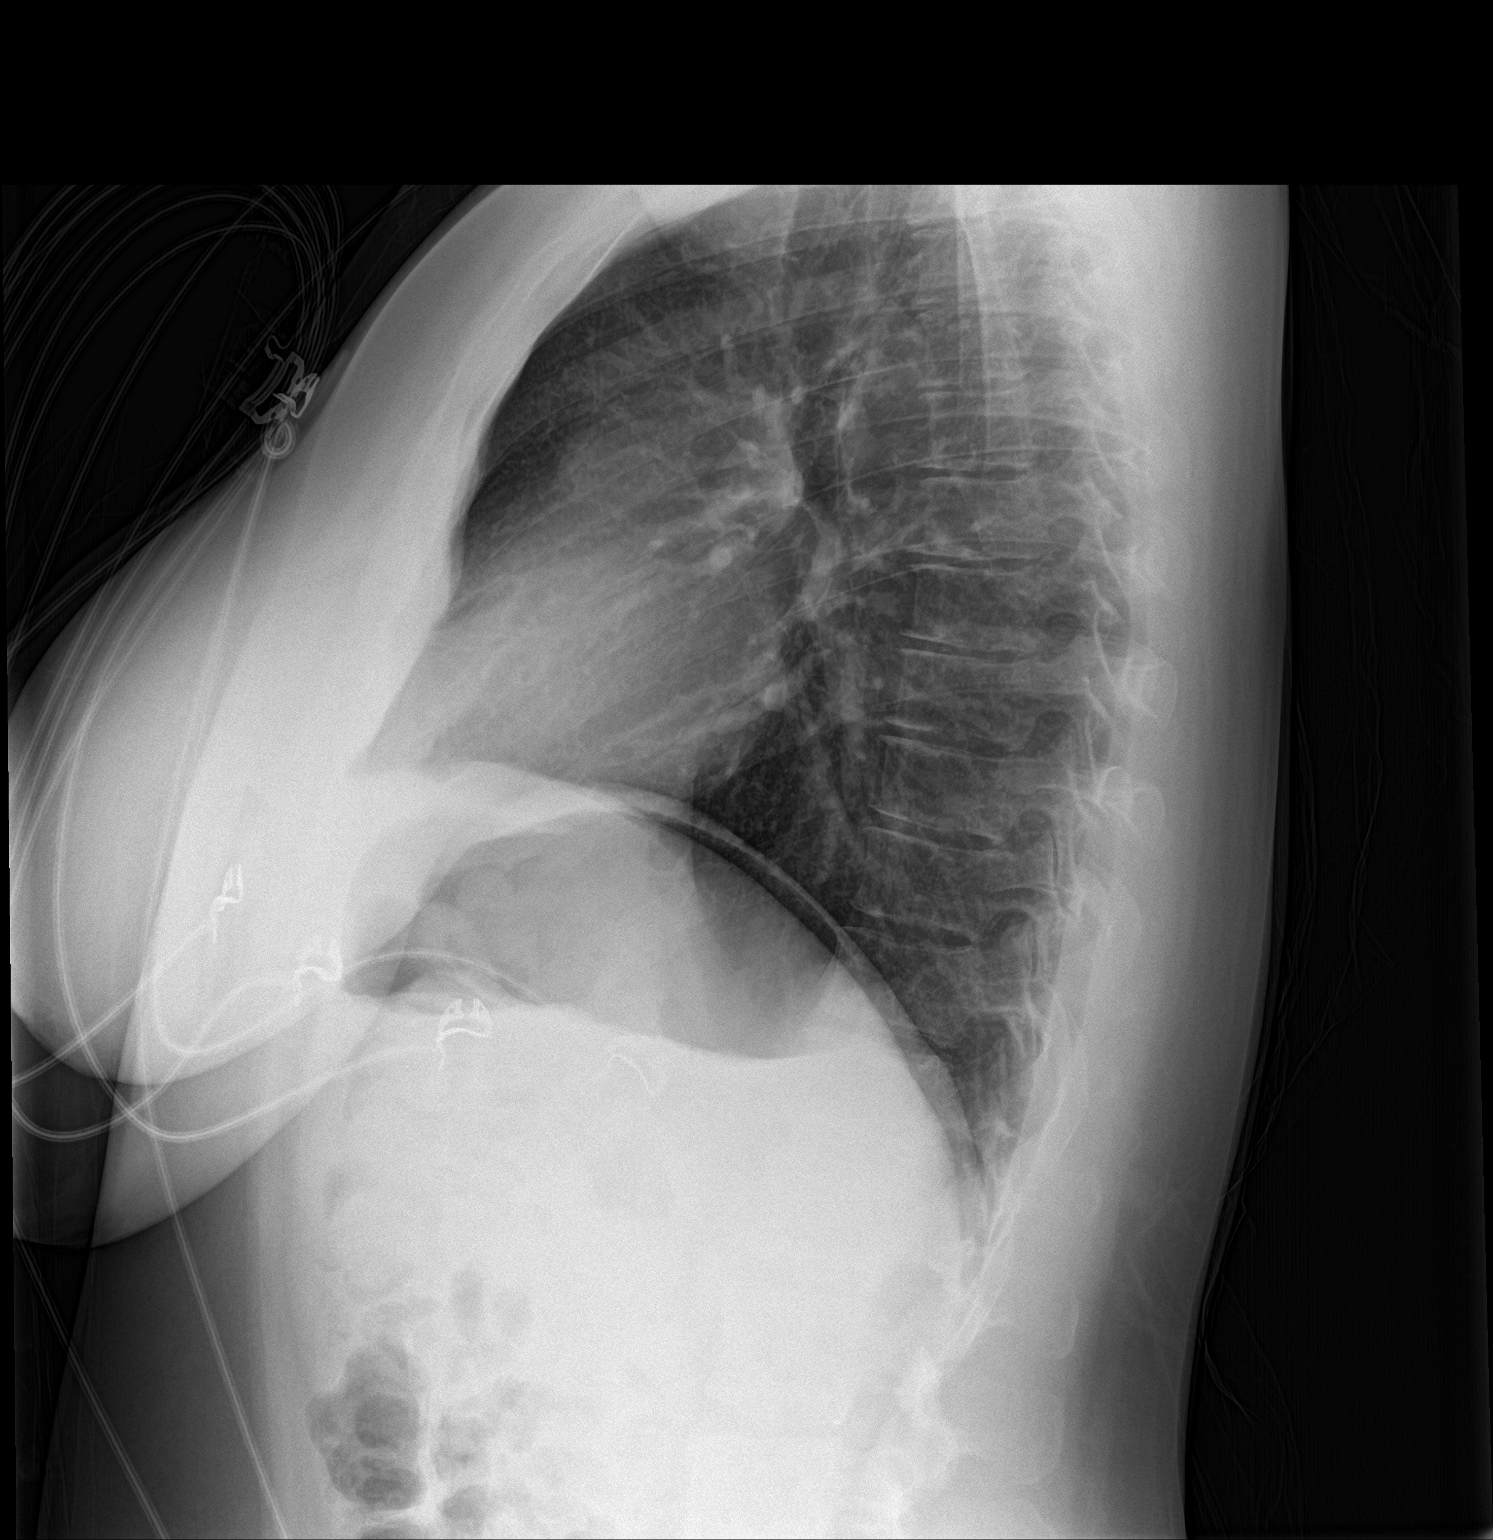

[2 of 2 positions shown; findings below may reference images not displayed]

FINDINGS: Cardiomediastinal silhouette is normal. Mediastinal contours appear
intact.

There is no evidence of focal airspace consolidation, pleural
effusion or pneumothorax.

Osseous structures are without acute abnormality. Soft tissues are
grossly normal.
IMPRESSION: No active cardiopulmonary disease.

## 2021-05-26 ENCOUNTER — Telehealth: Payer: Self-pay

## 2021-05-26 NOTE — Telephone Encounter (Signed)
Called pt to find out Dr. Redmond School was still her PCP and if so we need to schedule at least a med check appt. [place]
